# Patient Record
Sex: Female | Born: 1960 | Race: Black or African American | Hispanic: No | Marital: Single | Smoking: Never smoker
Health system: Southern US, Community
[De-identification: ages and names within clinical notes are randomized; demographics above are authoritative.]

## PROBLEM LIST (undated history)

## (undated) DIAGNOSIS — T7840XA Allergy, unspecified, initial encounter: Secondary | ICD-10-CM

## (undated) DIAGNOSIS — R7303 Prediabetes: Secondary | ICD-10-CM

## (undated) DIAGNOSIS — D649 Anemia, unspecified: Secondary | ICD-10-CM

## (undated) DIAGNOSIS — Z803 Family history of malignant neoplasm of breast: Secondary | ICD-10-CM

## (undated) DIAGNOSIS — N189 Chronic kidney disease, unspecified: Secondary | ICD-10-CM

## (undated) DIAGNOSIS — N3281 Overactive bladder: Secondary | ICD-10-CM

## (undated) DIAGNOSIS — K219 Gastro-esophageal reflux disease without esophagitis: Secondary | ICD-10-CM

## (undated) DIAGNOSIS — R011 Cardiac murmur, unspecified: Secondary | ICD-10-CM

## (undated) DIAGNOSIS — E875 Hyperkalemia: Secondary | ICD-10-CM

## (undated) DIAGNOSIS — Z800 Family history of malignant neoplasm of digestive organs: Secondary | ICD-10-CM

## (undated) DIAGNOSIS — Z801 Family history of malignant neoplasm of trachea, bronchus and lung: Secondary | ICD-10-CM

## (undated) HISTORY — DX: Anemia, unspecified: D64.9

## (undated) HISTORY — PX: BREAST SURGERY: SHX581

## (undated) HISTORY — DX: Overactive bladder: N32.81

## (undated) HISTORY — DX: Family history of malignant neoplasm of digestive organs: Z80.0

## (undated) HISTORY — DX: Allergy, unspecified, initial encounter: T78.40XA

## (undated) HISTORY — DX: Family history of malignant neoplasm of trachea, bronchus and lung: Z80.1

## (undated) HISTORY — PX: LAPAROTOMY: SHX154

## (undated) HISTORY — DX: Family history of malignant neoplasm of breast: Z80.3

## (undated) HISTORY — PX: BREAST EXCISIONAL BIOPSY: SUR124

---

## 2006-01-16 ENCOUNTER — Ambulatory Visit: Payer: Self-pay | Admitting: Obstetrics and Gynecology

## 2006-01-16 IMAGING — MG UNKNOWN MG STUDY
1 series · 4 of 4 positions shown · non-contrast
Comparison: none

REASON FOR EXAM: screening mammo
COMMENTS:

PROCEDURE:     MAM - MAM DGTL SCREENING MAMMO W/CAD  - [DATE]  [DATE]
RESULT:     Routine images were obtained and compared to prior study of
[DATE].  The breasts are dense and nodular. No dominant mass is seen. No
suspicious microcalcifications or areas of architectural distortion.

[Series 37: R CC · right · 4 of 4 slices shown]
[im 1/4]
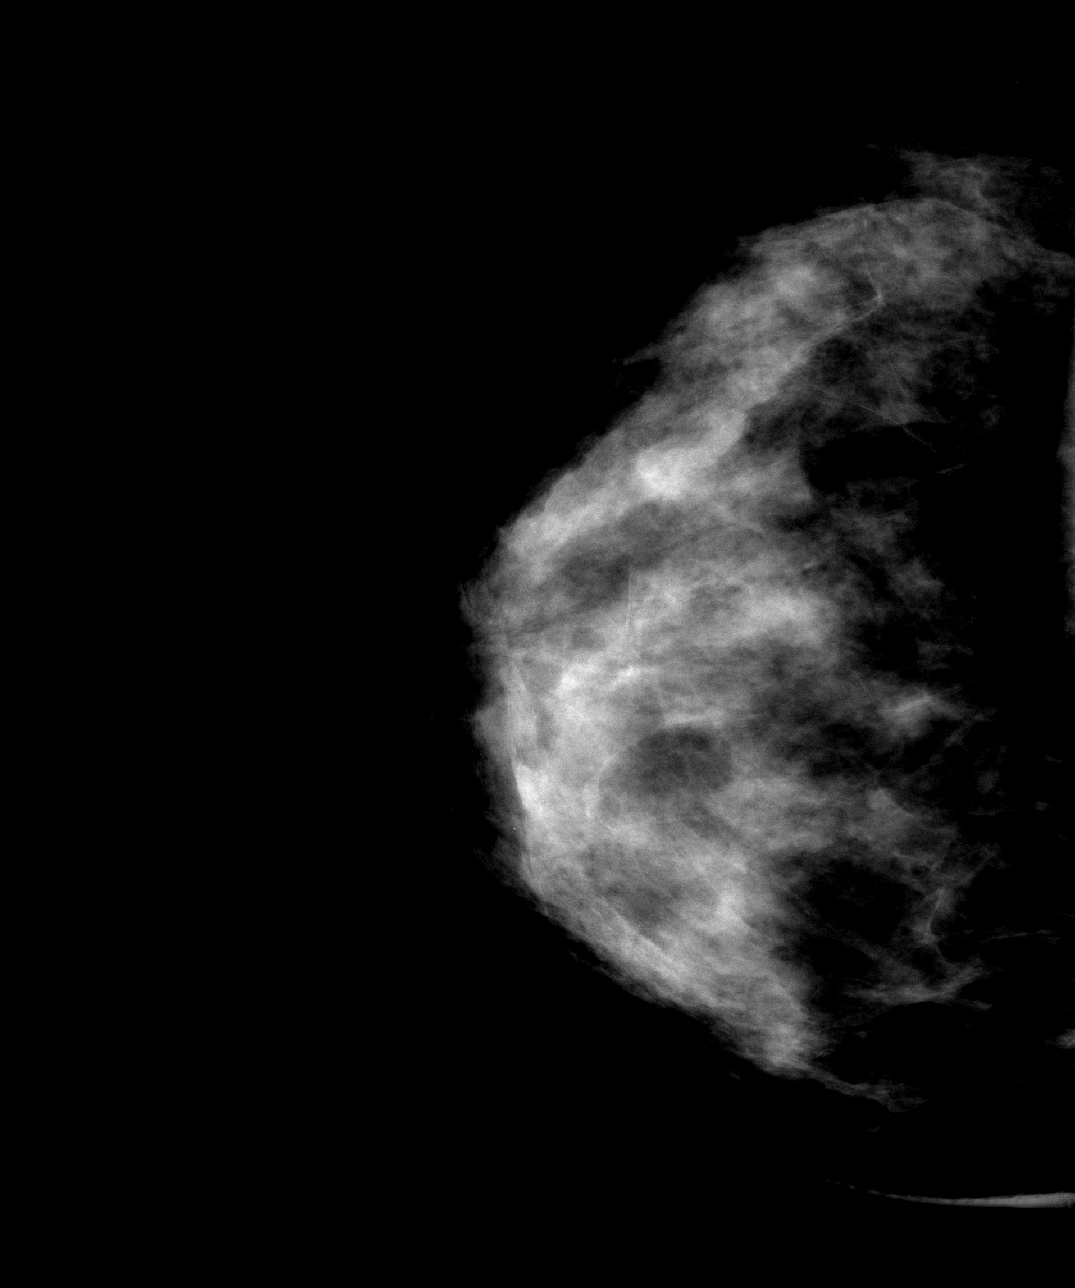
[im 2/4]
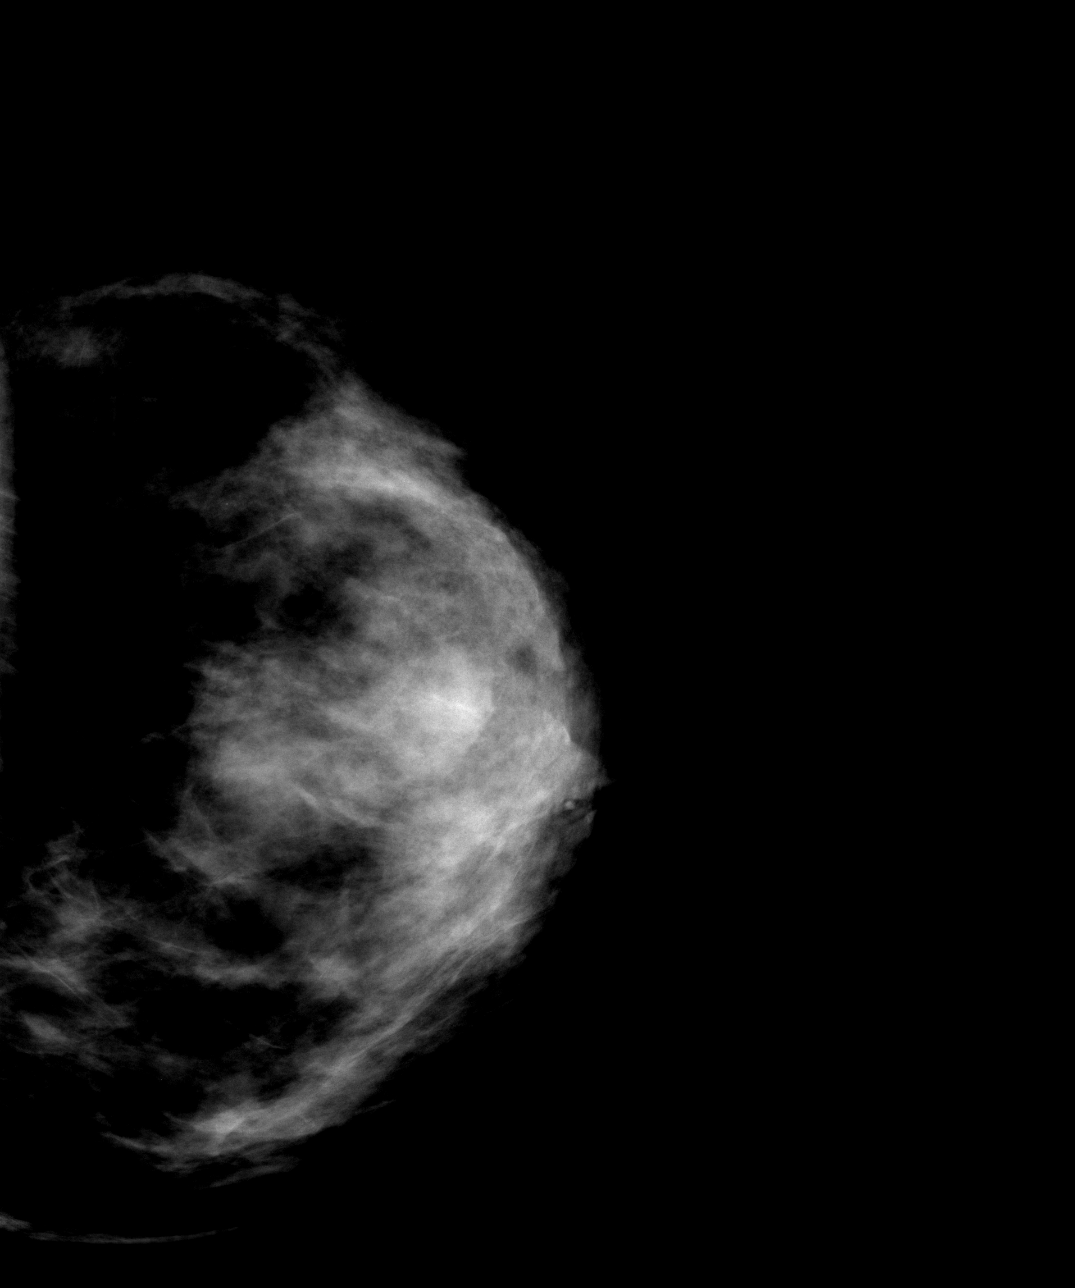
[im 3/4]
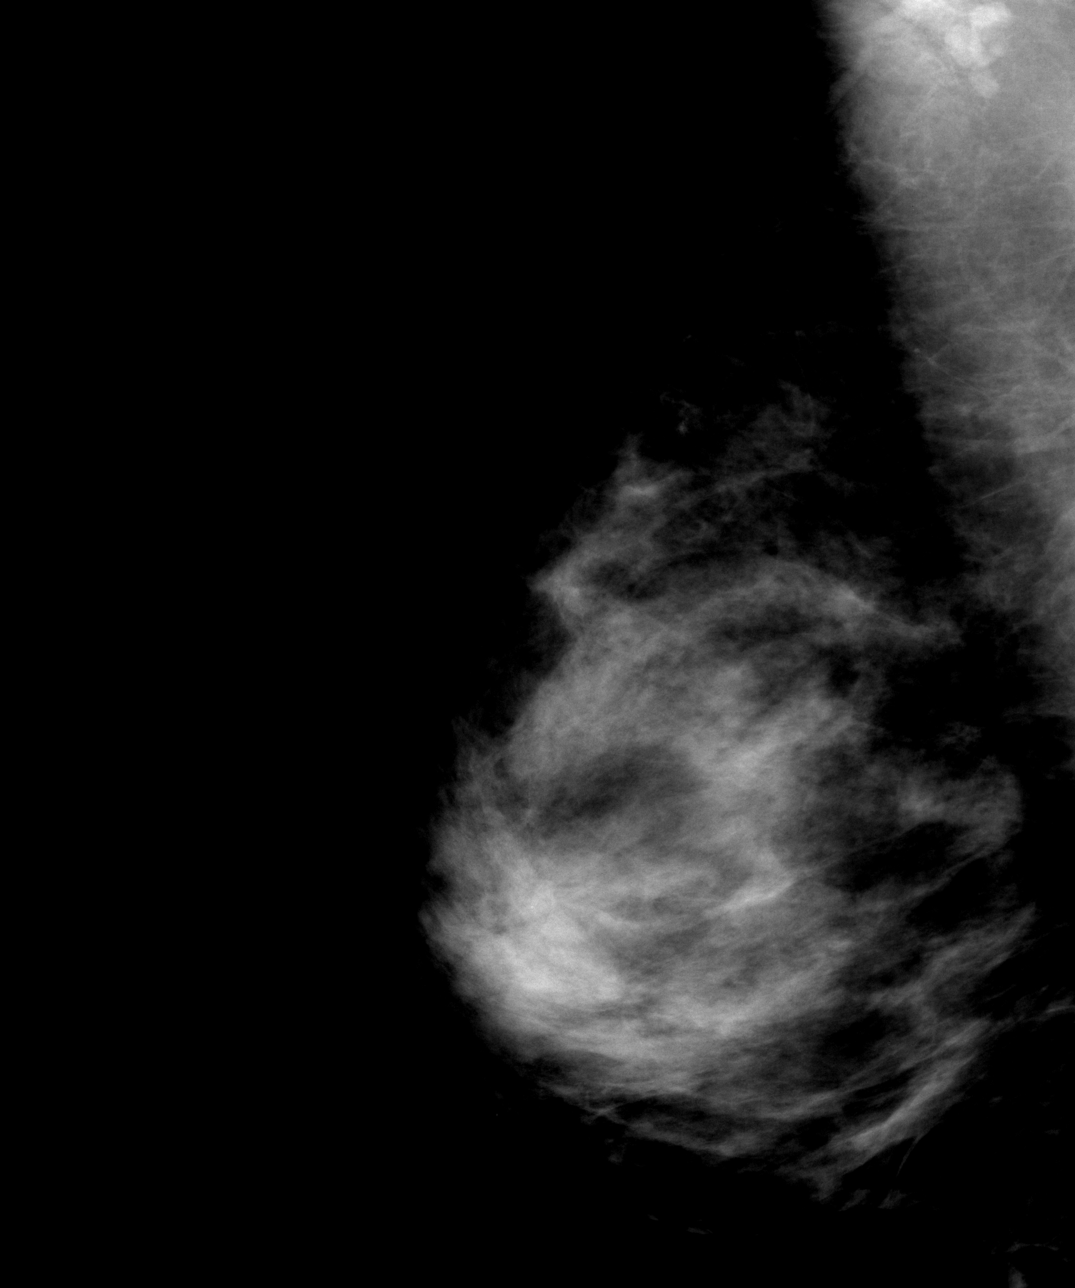
[im 4/4]
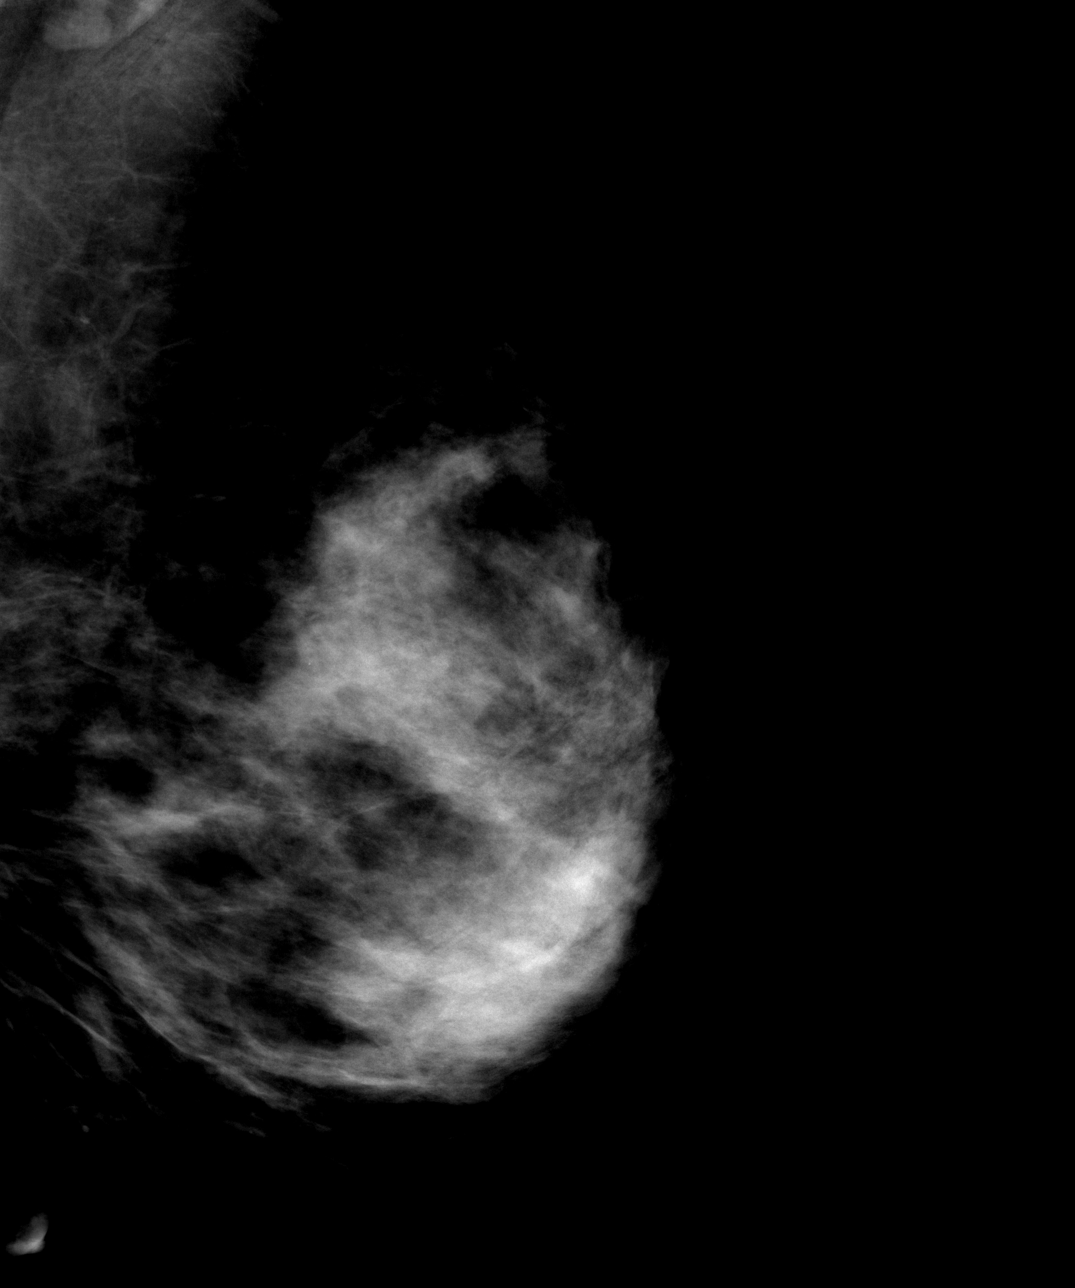

[4 of 4 positions shown; findings below may reference images not displayed]

IMPRESSION: 1)Dense, nodular breasts. Continued annual follow-up would be recommended.

BI-RADS: Category 2-Benign Finding

A NEGATIVE MAMMOGRAM REPORT DOES NOT PRECLUDE BIOPSY OR OTHER EVALUATION OF
A CLINICALLY PALPABLE OR OTHERWISE SUSPICIOUS MASS OR LESION.  BREAST CANCER
MAY NOT BE DETECTED BY MAMMOGRAPHY IN UP TO 10% OF CASES.

## 2007-01-17 ENCOUNTER — Ambulatory Visit: Payer: Self-pay | Admitting: Obstetrics and Gynecology

## 2007-01-17 IMAGING — MG UNKNOWN MG STUDY
1 series · 4 of 4 positions shown · non-contrast
Comparison: none

REASON FOR EXAM: screening mammo
COMMENTS:

PROCEDURE:     MAM - MAM DGTL SCREENING MAMMO W/CAD  - [DATE]  [DATE]
RESULT:     Comparison is made to the prior study dated [DATE] and [DATE].
The breasts demonstrate a heterogeneous fibronodular parenchymal pattern.
There does not appear to be radiographic evidence to suggest malignancy.

[R CC · right · 4 of 4 slices shown]
[im 1/4]
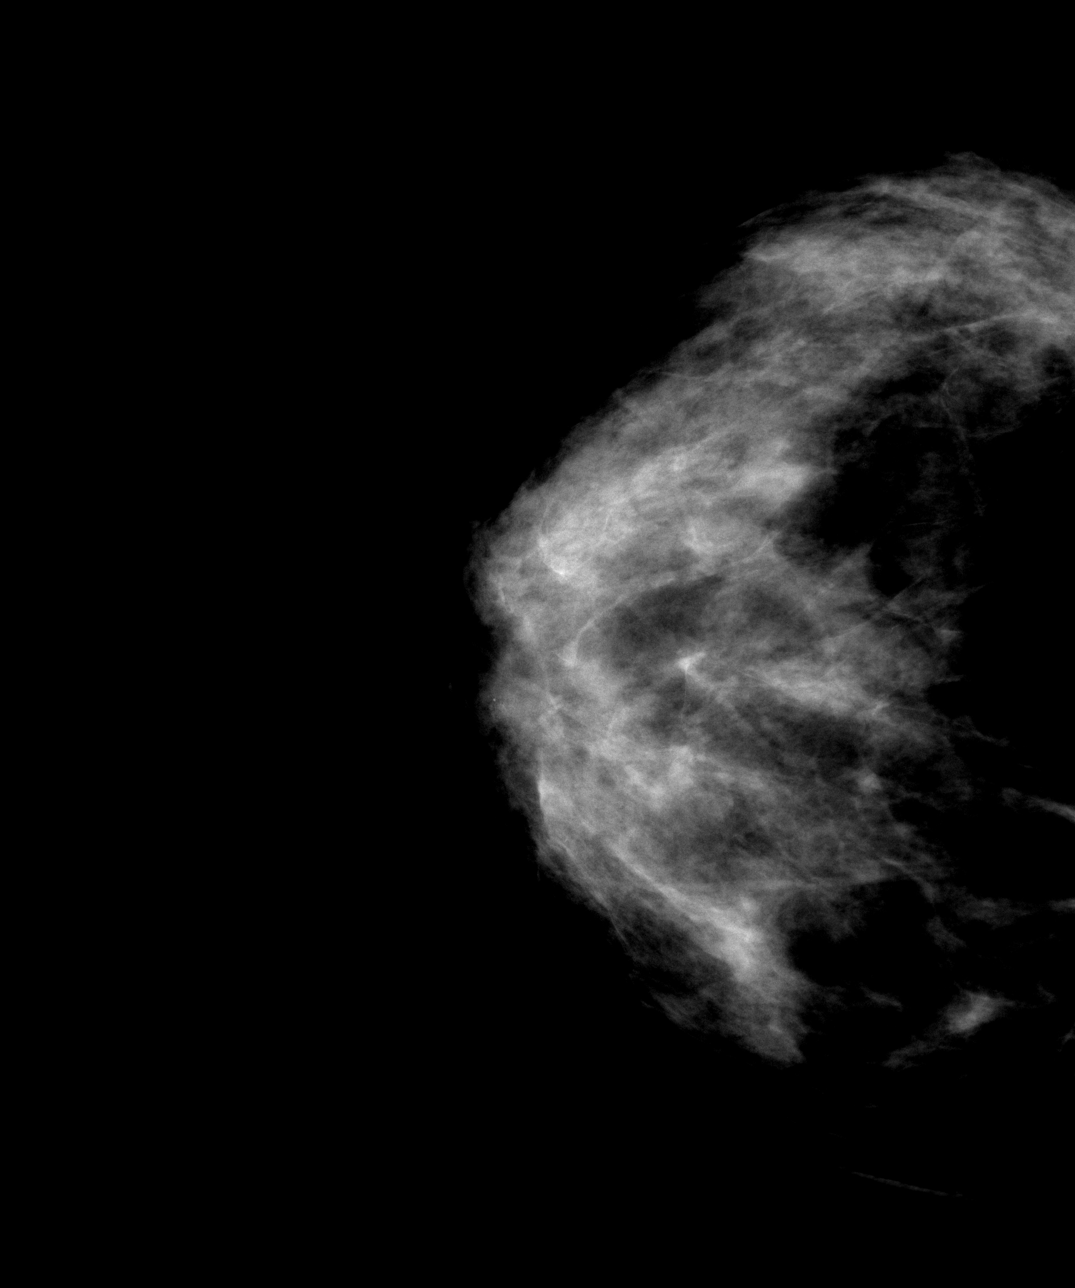
[im 2/4]
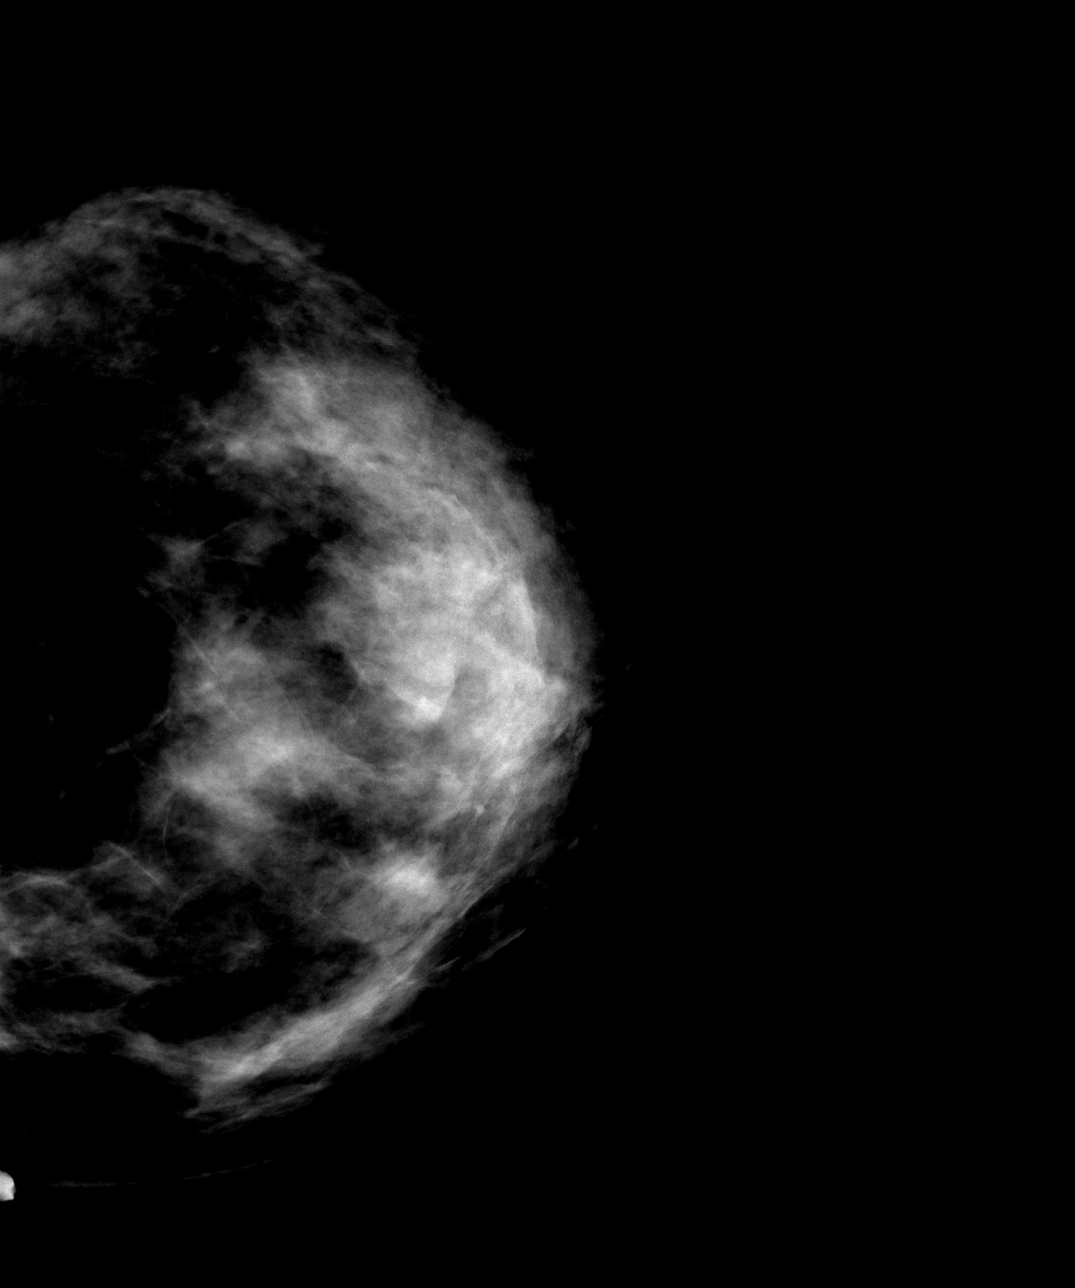
[im 3/4]
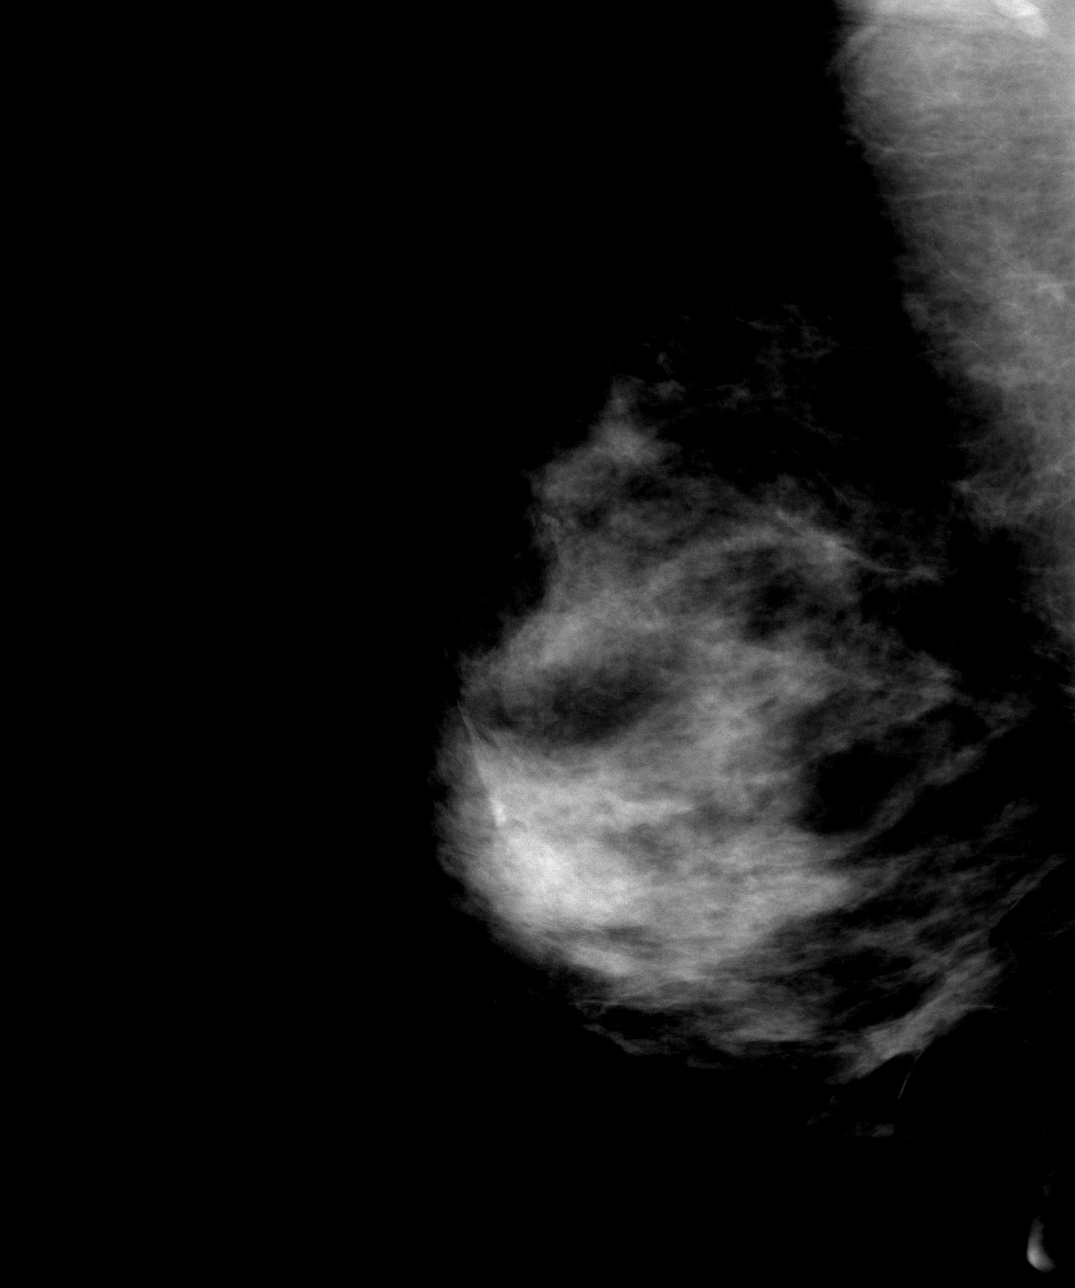
[im 4/4]
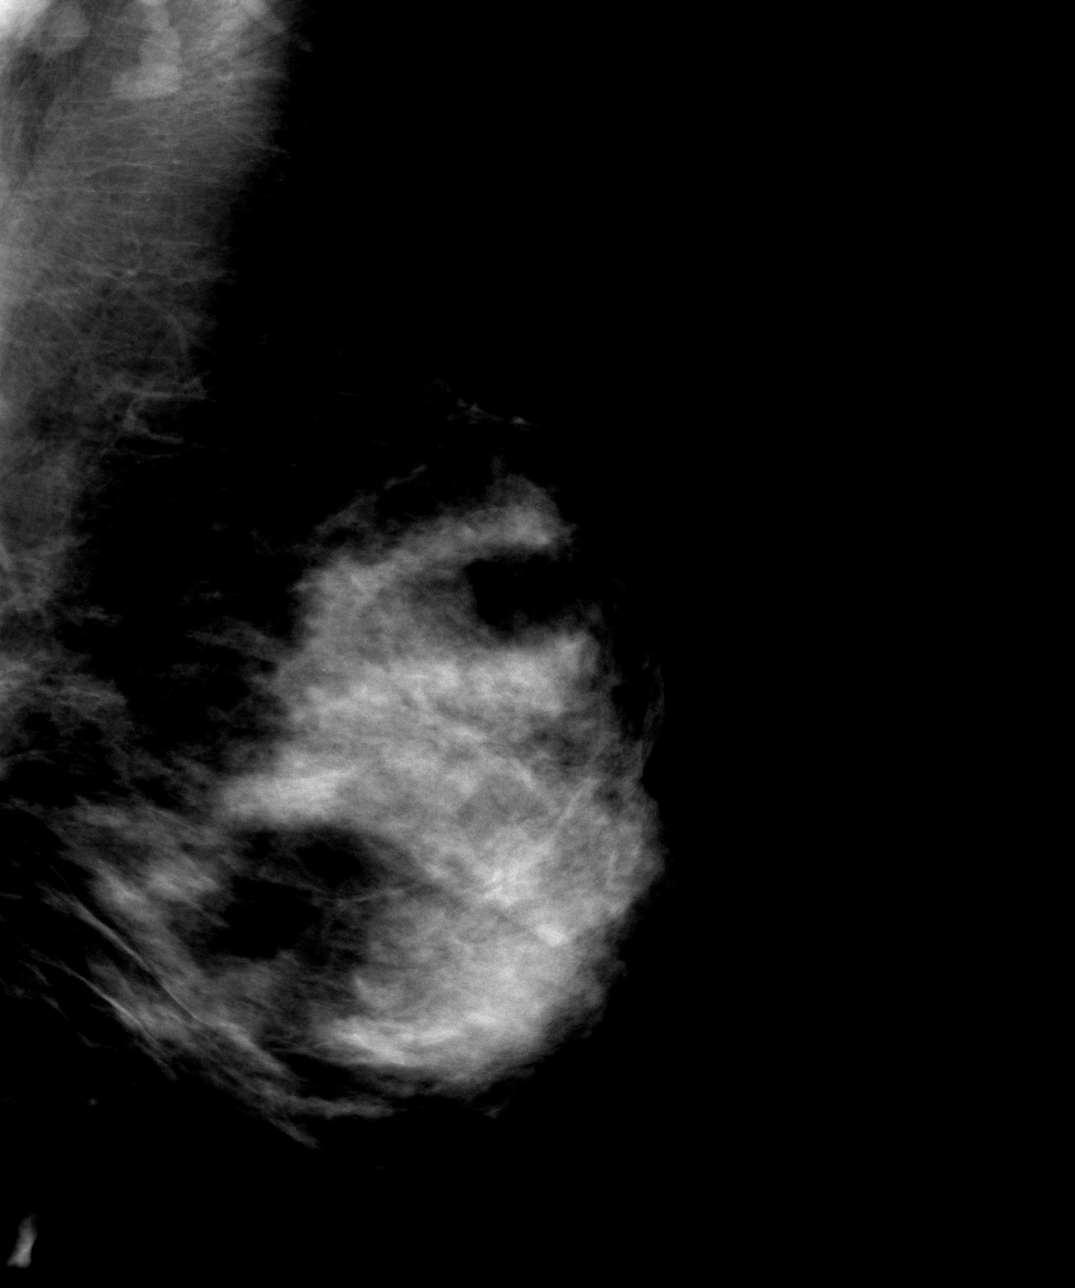

[4 of 4 positions shown; findings below may reference images not displayed]

IMPRESSION: Stable findings.

Corresponds to BI-RADS 2 - Benign Findings.

A NEGATIVE MAMMOGRAM REPORT DOES NOT PRECLUDE BIOPSY OR OTHER EVALUATION OF
A CLINICALLY PALPABLE OR OTHERWISE SUSPICIOUS MASS OR LESION.  BREAST CANCER
MAY NOT BE DETECTED BY MAMMOGRAPHY IN UP TO 10% OF CASES.

## 2011-11-13 ENCOUNTER — Ambulatory Visit (INDEPENDENT_AMBULATORY_CARE_PROVIDER_SITE_OTHER): Payer: BC Managed Care – PPO | Admitting: Internal Medicine

## 2011-11-13 ENCOUNTER — Encounter: Payer: Self-pay | Admitting: Internal Medicine

## 2011-11-13 ENCOUNTER — Other Ambulatory Visit: Payer: Self-pay | Admitting: *Deleted

## 2011-11-13 DIAGNOSIS — R319 Hematuria, unspecified: Secondary | ICD-10-CM | POA: Insufficient documentation

## 2011-11-13 DIAGNOSIS — Z0000 Encounter for general adult medical examination without abnormal findings: Secondary | ICD-10-CM

## 2011-11-13 DIAGNOSIS — K921 Melena: Secondary | ICD-10-CM

## 2011-11-13 LAB — COMPREHENSIVE METABOLIC PANEL
ALT: 25 U/L (ref 0–35)
Albumin: 3.9 g/dL (ref 3.5–5.2)
CO2: 28 mEq/L (ref 19–32)
Calcium: 9.4 mg/dL (ref 8.4–10.5)
Chloride: 104 mEq/L (ref 96–112)
GFR: 69.03 mL/min (ref >60.00)
Glucose, Bld: 83 mg/dL (ref 70–99)
Sodium: 141 mEq/L (ref 135–145)
Total Protein: 8.3 g/dL (ref 6.0–8.3)

## 2011-11-13 LAB — CBC WITH DIFFERENTIAL/PLATELET
Basophils Absolute: 0 10*3/uL (ref 0.0–0.1)
Eosinophils Relative: 0.6 % (ref 0.0–5.0)
Hemoglobin: 10.8 g/dL — ABNORMAL LOW (ref 12.0–15.0)
Lymphocytes Relative: 31.5 % (ref 12.0–46.0)
Monocytes Relative: 9.9 % (ref 3.0–12.0)
Platelets: 310 10*3/uL (ref 150.0–400.0)
RDW: 15.3 % — ABNORMAL HIGH (ref 11.5–14.6)
WBC: 9 10*3/uL (ref 4.5–10.5)

## 2011-11-13 LAB — LIPID PANEL
HDL: 57.6 mg/dL (ref >39.00)
Total CHOL/HDL Ratio: 3

## 2011-11-13 LAB — POCT URINALYSIS DIPSTICK
Nitrite, UA: NEGATIVE
Protein, UA: 30
Urobilinogen, UA: 0.2
pH, UA: 7

## 2011-11-13 MED ORDER — SULFAMETHOXAZOLE-TRIMETHOPRIM 800-160 MG PO TABS: 1.0000 | ORAL_TABLET | Freq: Two times a day (BID) | ORAL | Status: AC

## 2011-11-13 NOTE — Progress Notes (Signed)
Subjective:    Patient ID: Alejandra Hill, female    DOB: 1961/07/22, 50 y.o.   MRN: [id number]  HPI 50 year old female presents to establish care. Her primary concern today is a year-long history of intermittent episodes of bloody stool. She reports that she had been evaluated by her previous physician for this and was set up for colonoscopy but she canceled this appointment. She notes that she was diagnosed with iron deficiency and has been treated with oral iron supplementation. She reports that the episodes of bloody stool occur only on occasion and not with every bowel movement. This is described as blood in the toilet and not just drops of blood on the toilet paper. She denies any black or tarry stool. She denies any abdominal pain. She also reports some blood in her urine. She denies any dysuria. She denies any flank pain. She does have increased urinary frequency. She notes that she was treated in the past by her physician with the medication to help control urinary leakage. She denies any fever or chills.  Outpatient Encounter Prescriptions as of 11/13/2011  Medication Sig Dispense Refill  . Biotin 1000 MCG tablet Take 1,000 mcg by mouth daily.        . Calcium Carb-Cholecalciferol (RA CALCIUM 600/VITAMIN D-3 PO) Take by mouth daily.        . cholecalciferol (VITAMIN D) 400 UNITS TABS Take 400 Units by mouth daily.        . cyanocobalamin 100 MCG tablet Take 50 mcg by mouth daily.        . ferrous gluconate (FERGON) 240 (27 FE) MG tablet Take 240 mg by mouth 2 (two) times daily.        . vitamin E 400 UNIT capsule Take 400 Units by mouth daily.          Review of Systems  Constitutional: Negative for fever, chills, appetite change, fatigue and unexpected weight change.  HENT: Negative for ear pain, congestion, sore throat, trouble swallowing, neck pain, voice change and sinus pressure.   Eyes: Negative for visual disturbance.  Respiratory: Negative for cough, shortness of breath,  wheezing and stridor.   Cardiovascular: Negative for chest pain, palpitations and leg swelling.  Gastrointestinal: Positive for blood in stool. Negative for nausea, vomiting, abdominal pain, diarrhea, constipation, abdominal distention and anal bleeding.  Genitourinary: Positive for frequency and hematuria. Negative for dysuria, urgency, flank pain, vaginal bleeding, vaginal discharge and vaginal pain.  Musculoskeletal: Negative for myalgias, arthralgias and gait problem.  Skin: Negative for color change and rash.  Neurological: Negative for dizziness and headaches.  Hematological: Negative for adenopathy. Does not bruise/bleed easily.  Psychiatric/Behavioral: Negative for suicidal ideas, sleep disturbance and dysphoric mood. The patient is not nervous/anxious.    BP 128/78  Pulse 69  Temp(Src) 97.9 F (36.6 C) (Oral)  Resp 16  Ht 5\' 5"  (1.651 m)  Wt 177 lb 8 oz (80.513 kg)  BMI 29.54 kg/m2  SpO2 99%  LMP 10/14/2011     Objective:   Physical Exam  Constitutional: She is oriented to person, place, and time. She appears well-developed and well-nourished. No distress.  HENT:  Head: Normocephalic and atraumatic.  Right Ear: External ear normal.  Left Ear: External ear normal.  Nose: Nose normal.  Mouth/Throat: Oropharynx is clear and moist. No oropharyngeal exudate.  Eyes: Conjunctivae are normal. Pupils are equal, round, and reactive to light. Right eye exhibits no discharge. Left eye exhibits no discharge. No scleral icterus.  Neck: Normal range of motion.  Neck supple. No tracheal deviation present. No thyromegaly present.  Cardiovascular: Normal rate, regular rhythm, normal heart sounds and intact distal pulses.  Exam reveals no gallop and no friction rub.   No murmur heard. Pulmonary/Chest: Effort normal and breath sounds normal. No respiratory distress. She has no wheezes. She has no rales. She exhibits no tenderness.  Abdominal: Soft. Bowel sounds are normal. She exhibits no  distension and no mass. There is no tenderness. There is no rebound and no guarding.  Musculoskeletal: Normal range of motion. She exhibits no edema and no tenderness.  Lymphadenopathy:    She has no cervical adenopathy.  Neurological: She is alert and oriented to person, place, and time. No cranial nerve deficit. She exhibits normal muscle tone. Coordination normal.  Skin: Skin is warm and dry. No rash noted. She is not diaphoretic. No erythema. No pallor.  Psychiatric: She has a normal mood and affect. Her behavior is normal. Judgment and thought content normal.          Assessment & Plan:  1. Hematochezia - suspect internal hemorrhoids. Will set her up again for colonoscopy. Will review her previous evaluation by her former physician. Will check CBC with labs today. She will followup in 2 weeks.  2. Hematuria - Symptoms not consistent with infection. Will check urinalysis with labs today. If hematuria present will set up urology evaluation.  3. Health maintenance - Will get records on previous evaluation and management. Will send CBC, CMP, lipids with labs today.  Follow up 2 weeks.

## 2011-11-13 NOTE — Progress Notes (Signed)
Addended by: Ronette Deter A on: 11/13/2011 12:34 PM   Modules accepted: Level of Service

## 2011-11-13 NOTE — Progress Notes (Signed)
Addended by: Alois Cliche on: 11/13/2011 02:18 PM   Modules accepted: Orders

## 2011-11-14 ENCOUNTER — Telehealth: Payer: Self-pay | Admitting: *Deleted

## 2011-11-14 NOTE — Telephone Encounter (Signed)
Spoke w/GI office at [facility] and explained patient's situation; was able to place her on the waiting list and also get her a work-in appointment scheduled in three weeks for 12.12.12 at 10:30am [pt will have a wait]. Dr Gilford Rile informed. Patient informed.

## 2011-11-14 NOTE — Telephone Encounter (Signed)
LMOM to inform patient concerning lab results, expediting referral w/GI; informed that we will be in contact with her shortly about this appointment.

## 2011-11-14 NOTE — Telephone Encounter (Signed)
Message copied by Rockwell Germany on Tue Nov 14, 2011  8:24 AM ------      Message from: Ronette Deter A      Created: Mon Nov 13, 2011  7:10 PM       Labs show that pt is anemic with hemoglobin of 10. Likely secondary to iron deficiency from blood loss in stool.  Need to expedite her evaluation with GI for colonoscopy.            Larene Beach, Can you please make sure eval with GI is asap? Thanks (If cannot get within 2 weeks, let me know, I will call dr.)

## 2011-11-16 LAB — URINE CULTURE

## 2011-11-29 ENCOUNTER — Ambulatory Visit: Payer: BC Managed Care – PPO | Admitting: Internal Medicine

## 2011-12-27 ENCOUNTER — Ambulatory Visit: Payer: Self-pay | Admitting: Gastroenterology

## 2012-01-10 ENCOUNTER — Ambulatory Visit (INDEPENDENT_AMBULATORY_CARE_PROVIDER_SITE_OTHER): Payer: BC Managed Care – PPO | Admitting: Internal Medicine

## 2012-01-10 ENCOUNTER — Encounter: Payer: Self-pay | Admitting: Internal Medicine

## 2012-01-10 VITALS — BP 118/62 | HR 69 | Temp 97.9°F | Ht 64.0 in | Wt 182.0 lb

## 2012-01-10 DIAGNOSIS — D649 Anemia, unspecified: Secondary | ICD-10-CM

## 2012-01-10 LAB — CBC WITH DIFFERENTIAL/PLATELET
Basophils Relative: 0.5 % (ref 0.0–3.0)
Eosinophils Absolute: 0.1 10*3/uL (ref 0.0–0.7)
Eosinophils Relative: 1.7 % (ref 0.0–5.0)
Hemoglobin: 11.4 g/dL — ABNORMAL LOW (ref 12.0–15.0)
Lymphocytes Relative: 37.6 % (ref 12.0–46.0)
MCHC: 33.7 g/dL (ref 30.0–36.0)
MCV: 86.7 fl (ref 78.0–100.0)
Neutro Abs: 4 10*3/uL (ref 1.4–7.7)
RBC: 3.92 Mil/uL (ref 3.87–5.11)
WBC: 7.7 10*3/uL (ref 4.5–10.5)

## 2012-01-10 NOTE — Progress Notes (Signed)
Subjective:    Patient ID: Alejandra Hill, female    DOB: 12/15/1961, 51 y.o.   MRN: [id number]  HPI 51 year old female with a recent history of hematochezia and iron deficiency anemia presents for followup. At her last visit, she was referred to GI medicine and had colonoscopy. She reports that colonoscopy was normal. She has not had any further episodes of hematochezia. She notes that she has resumed her menstrual cycles in the cycles have been heavy. However, she is not interested in any intervention such as NovaSure to help lessen blood flow. She reports that otherwise she is feeling well. She has no other complaints today.  Outpatient Encounter Prescriptions as of 01/10/2012  Medication Sig Dispense Refill  . Biotin 1000 MCG tablet Take 1,000 mcg by mouth daily.        . Calcium Carb-Cholecalciferol (RA CALCIUM 600/VITAMIN D-3 PO) Take by mouth daily.        . cholecalciferol (VITAMIN D) 400 UNITS TABS Take 400 Units by mouth daily.        . cyanocobalamin 100 MCG tablet Take 50 mcg by mouth daily.        . ferrous gluconate (FERGON) 240 (27 FE) MG tablet Take 240 mg by mouth 2 (two) times daily.        . vitamin E 400 UNIT capsule Take 400 Units by mouth daily.          Review of Systems  Constitutional: Negative for fever, chills, appetite change, fatigue and unexpected weight change.  HENT: Negative for ear pain, congestion, sore throat, trouble swallowing, neck pain, voice change and sinus pressure.   Eyes: Negative for visual disturbance.  Respiratory: Negative for cough, shortness of breath, wheezing and stridor.   Cardiovascular: Negative for chest pain, palpitations and leg swelling.  Gastrointestinal: Negative for nausea, vomiting, abdominal pain, diarrhea, constipation, blood in stool, abdominal distention and anal bleeding.  Genitourinary: Negative for dysuria and flank pain.  Musculoskeletal: Negative for myalgias, arthralgias and gait problem.  Skin: Negative for color  change and rash.  Neurological: Negative for dizziness and headaches.  Hematological: Negative for adenopathy. Does not bruise/bleed easily.  Psychiatric/Behavioral: Negative for suicidal ideas, sleep disturbance and dysphoric mood. The patient is not nervous/anxious.    BP 118/62  Pulse 69  Temp(Src) 97.9 F (36.6 C) (Oral)  Ht 5\' 4"  (1.626 m)  Wt 182 lb (82.555 kg)  BMI 31.24 kg/m2  SpO2 99%     Objective:   Physical Exam  Constitutional: She is oriented to person, place, and time. She appears well-developed and well-nourished. No distress.  HENT:  Head: Normocephalic and atraumatic.  Right Ear: External ear normal.  Left Ear: External ear normal.  Nose: Nose normal.  Mouth/Throat: Oropharynx is clear and moist. No oropharyngeal exudate.  Eyes: Conjunctivae are normal. Pupils are equal, round, and reactive to light. Right eye exhibits no discharge. Left eye exhibits no discharge. No scleral icterus.  Neck: Normal range of motion. Neck supple. No tracheal deviation present. No thyromegaly present.  Cardiovascular: Normal rate, regular rhythm, normal heart sounds and intact distal pulses.  Exam reveals no gallop and no friction rub.   No murmur heard. Pulmonary/Chest: Effort normal and breath sounds normal. No respiratory distress. She has no wheezes. She has no rales. She exhibits no tenderness.  Musculoskeletal: Normal range of motion. She exhibits no edema and no tenderness.  Lymphadenopathy:    She has no cervical adenopathy.  Neurological: She is alert and oriented to person, place, and  time. No cranial nerve deficit. She exhibits normal muscle tone. Coordination normal.  Skin: Skin is warm and dry. No rash noted. She is not diaphoretic. No erythema. No pallor.  Psychiatric: She has a normal mood and affect. Her behavior is normal. Judgment and thought content normal.          Assessment & Plan:  1. Anemia - Likely secondary to iron deficiency. No further hematochezia.   Menstrual cycles have been heavy per pt, but she does not want to consider Novasure or other intervention to help control blood loss. Will recheck CBC today. Continue Ferrous Sulfate. Follow up 6 months and prn.

## 2012-05-09 ENCOUNTER — Encounter: Payer: Self-pay | Admitting: Internal Medicine

## 2012-05-09 ENCOUNTER — Ambulatory Visit (INDEPENDENT_AMBULATORY_CARE_PROVIDER_SITE_OTHER): Payer: BC Managed Care – PPO | Admitting: Internal Medicine

## 2012-05-09 ENCOUNTER — Encounter: Payer: BC Managed Care – PPO | Admitting: Internal Medicine

## 2012-05-09 ENCOUNTER — Other Ambulatory Visit (HOSPITAL_COMMUNITY)
Admission: RE | Admit: 2012-05-09 | Discharge: 2012-05-09 | Disposition: A | Discharge status: Home / Self Care | Payer: BC Managed Care – PPO | Source: Ambulatory Visit | Attending: Internal Medicine | Admitting: Internal Medicine

## 2012-05-09 VITALS — BP 105/65 | HR 74 | Temp 97.7°F | Resp 16 | Ht 64.0 in | Wt 177.2 lb

## 2012-05-09 DIAGNOSIS — Z1159 Encounter for screening for other viral diseases: Secondary | ICD-10-CM | POA: Insufficient documentation

## 2012-05-09 DIAGNOSIS — Z01419 Encounter for gynecological examination (general) (routine) without abnormal findings: Secondary | ICD-10-CM | POA: Insufficient documentation

## 2012-05-09 DIAGNOSIS — R232 Flushing: Secondary | ICD-10-CM | POA: Insufficient documentation

## 2012-05-09 DIAGNOSIS — Z0000 Encounter for general adult medical examination without abnormal findings: Secondary | ICD-10-CM

## 2012-05-09 DIAGNOSIS — N951 Menopausal and female climacteric states: Secondary | ICD-10-CM

## 2012-05-09 LAB — CBC WITH DIFFERENTIAL/PLATELET
Basophils Absolute: 0 10*3/uL (ref 0.0–0.1)
Basophils Relative: 0.5 % (ref 0.0–3.0)
Eosinophils Absolute: 0.1 10*3/uL (ref 0.0–0.7)
Hemoglobin: 10.3 g/dL — ABNORMAL LOW (ref 12.0–15.0)
Lymphocytes Relative: 24.2 % (ref 12.0–46.0)
Lymphs Abs: 2.1 10*3/uL (ref 0.7–4.0)
MCHC: 32.7 g/dL (ref 30.0–36.0)
MCV: 85.8 fl (ref 78.0–100.0)
Monocytes Absolute: 0.9 10*3/uL (ref 0.1–1.0)
Neutro Abs: 5.4 10*3/uL (ref 1.4–7.7)
RBC: 3.66 Mil/uL — ABNORMAL LOW (ref 3.87–5.11)
RDW: 14.5 % (ref 11.5–14.6)

## 2012-05-09 LAB — COMPREHENSIVE METABOLIC PANEL
ALT: 24 U/L (ref 0–35)
AST: 23 U/L (ref 0–37)
Alkaline Phosphatase: 100 U/L (ref 39–117)
BUN: 16 mg/dL (ref 6–23)
Calcium: 9.3 mg/dL (ref 8.4–10.5)
Chloride: 104 mEq/L (ref 96–112)
Creatinine, Ser: 1 mg/dL (ref 0.4–1.2)
Total Bilirubin: 0.3 mg/dL (ref 0.3–1.2)

## 2012-05-09 LAB — LIPID PANEL
Cholesterol: 156 mg/dL (ref 0–200)
HDL: 52.7 mg/dL (ref >39.00)
Triglycerides: 53 mg/dL (ref 0.0–149.0)
VLDL: 10.6 mg/dL (ref 0.0–40.0)

## 2012-05-09 LAB — POCT URINALYSIS DIPSTICK
Bilirubin, UA: NEGATIVE
Glucose, UA: NEGATIVE
Nitrite, UA: NEGATIVE

## 2012-05-09 LAB — TSH: TSH: 0.68 u[IU]/mL (ref 0.35–5.50)

## 2012-05-09 LAB — LUTEINIZING HORMONE: LH: 47.09 m[IU]/mL

## 2012-05-09 NOTE — Progress Notes (Signed)
Addended by: Alois Cliche on: 05/09/2012 03:15 PM   Modules accepted: Orders

## 2012-05-09 NOTE — Assessment & Plan Note (Signed)
General exam including pelvic exam and breast exam are normal today. Pap is pending. Will check lab work including CBC, CMP, lipids, TSH, LH, and [place]. Mammogram was ordered. Patient will followup in 6 months or sooner if needed.

## 2012-05-09 NOTE — Assessment & Plan Note (Signed)
Likely secondary to menopause. Will check LH and FSH with labs.

## 2012-05-09 NOTE — Progress Notes (Signed)
Subjective:    Patient ID: Alejandra Hill, female    DOB: [birth year], 50 y.o.   MRN: [id number]  HPI 51 year old female with history of iron deficiency anemia presents for annual exam. She reports that she is generally feeling well with the exception of occasional hot flashes. She notes these intermittently throughout the day and night. They're becoming more frequent. She has had some irregular menses and less frequent menses. She denies any other concerns today. She reports normal energy level. She reports normal appetite. She denies any changes in bowel habits. She reports full compliance with her iron supplements.  Outpatient Encounter Prescriptions as of 05/09/2012  Medication Sig Dispense Refill  . Biotin 1000 MCG tablet Take 1,000 mcg by mouth daily.        . Calcium Carb-Cholecalciferol (RA CALCIUM 600/VITAMIN D-3 PO) Take by mouth daily.        . cholecalciferol (VITAMIN D) 400 UNITS TABS Take 400 Units by mouth daily.        . cyanocobalamin 100 MCG tablet Take 50 mcg by mouth daily.        . ferrous gluconate (FERGON) 240 (27 FE) MG tablet Take 240 mg by mouth 2 (two) times daily.        . vitamin E 400 UNIT capsule Take 400 Units by mouth daily.          Review of Systems  Constitutional: Positive for diaphoresis. Negative for fever, chills, appetite change, fatigue and unexpected weight change.  HENT: Negative for ear pain, congestion, sore throat, trouble swallowing, neck pain, voice change and sinus pressure.   Eyes: Negative for visual disturbance.  Respiratory: Negative for cough, shortness of breath, wheezing and stridor.   Cardiovascular: Negative for chest pain, palpitations and leg swelling.  Gastrointestinal: Negative for nausea, vomiting, abdominal pain, diarrhea, constipation, blood in stool, abdominal distention and anal bleeding.  Genitourinary: Negative for dysuria and flank pain.  Musculoskeletal: Negative for myalgias, arthralgias and gait problem.  Skin: Negative  for color change and rash.  Neurological: Negative for dizziness and headaches.  Hematological: Negative for adenopathy. Does not bruise/bleed easily.  Psychiatric/Behavioral: Negative for suicidal ideas, sleep disturbance and dysphoric mood. The patient is not nervous/anxious.    BP 105/65  Pulse 74  Temp(Src) 97.7 F (36.5 C) (Oral)  Resp 16  Ht 5\' 4"  (1.626 m)  Wt 177 lb 4 oz (80.4 kg)  BMI 30.42 kg/m2  SpO2 99%  LMP 10/14/2011     Objective:   Physical Exam  Constitutional: She is oriented to person, place, and time. She appears well-developed and well-nourished. No distress.  HENT:  Head: Normocephalic and atraumatic.  Right Ear: External ear normal.  Left Ear: External ear normal.  Nose: Nose normal.  Mouth/Throat: Oropharynx is clear and moist. No oropharyngeal exudate.  Eyes: Conjunctivae are normal. Pupils are equal, round, and reactive to light. Right eye exhibits no discharge. Left eye exhibits no discharge. No scleral icterus.  Neck: Normal range of motion. Neck supple. No tracheal deviation present. No thyromegaly present.  Cardiovascular: Normal rate, regular rhythm, normal heart sounds and intact distal pulses.  Exam reveals no gallop and no friction rub.   No murmur heard. Pulmonary/Chest: Effort normal and breath sounds normal. No respiratory distress. She has no wheezes. She has no rales. She exhibits no tenderness.  Abdominal: Soft. Bowel sounds are normal. She exhibits no distension and no mass. There is no tenderness. There is no rebound and no guarding.  Genitourinary: Rectum normal, vagina normal  and uterus normal. No breast swelling, tenderness, discharge or bleeding. Pelvic exam was performed with patient supine. There is no rash, tenderness or lesion on the right labia. There is no rash, tenderness or lesion on the left labia. Uterus is not enlarged and not tender. Cervix exhibits no motion tenderness, no discharge and no friability. Right adnexum displays no  mass, no tenderness and no fullness. Left adnexum displays no mass, no tenderness and no fullness. No erythema or tenderness around the vagina. No vaginal discharge found.  Musculoskeletal: Normal range of motion. She exhibits no edema and no tenderness.  Lymphadenopathy:    She has no cervical adenopathy.  Neurological: She is alert and oriented to person, place, and time. No cranial nerve deficit. She exhibits normal muscle tone. Coordination normal.  Skin: Skin is warm and dry. No rash noted. She is not diaphoretic. No erythema. No pallor.  Psychiatric: She has a normal mood and affect. Her behavior is normal. Judgment and thought content normal.          Assessment & Plan:

## 2012-05-12 LAB — URINE CULTURE: Colony Count: >=100000

## 2012-05-13 ENCOUNTER — Other Ambulatory Visit: Payer: Self-pay | Admitting: *Deleted

## 2012-05-13 LAB — HM PAP SMEAR: HM Pap smear: NORMAL

## 2012-05-13 MED ORDER — SULFAMETHOXAZOLE-TRIMETHOPRIM 400-80 MG PO TABS: 1.0000 | ORAL_TABLET | Freq: Two times a day (BID) | ORAL | Status: AC

## 2012-08-20 ENCOUNTER — Telehealth: Payer: Self-pay | Admitting: Internal Medicine

## 2012-08-20 NOTE — Telephone Encounter (Signed)
Caller: [place]/Patient; Patient Name: Alejandra Hill, Alejandra Hill; PCP: Ronette Deter (Adults only); Best Callback Phone Number: [phone] Calling about having watery itchy eyes for past 5 years. She has tried Benedryl and Visine Allergy Eye Drops and not helping. L eye has whitish yellowish discharge onset 08/17/12 and eye feels gritty and itchy. Triage and Care advice per Eye Infection or Irritation Protocol and appnt advised within 4 hours. She is refusing appnt today and requested appnt on 08/21/12. Scheduled for 1130 -08/21/12 with Dr. Gilford Rile.

## 2012-08-21 ENCOUNTER — Ambulatory Visit: Payer: BC Managed Care – PPO | Admitting: Internal Medicine

## 2012-11-11 ENCOUNTER — Ambulatory Visit: Payer: BC Managed Care – PPO | Admitting: Internal Medicine

## 2012-11-13 ENCOUNTER — Encounter: Payer: Self-pay | Admitting: Internal Medicine

## 2012-11-13 ENCOUNTER — Ambulatory Visit (INDEPENDENT_AMBULATORY_CARE_PROVIDER_SITE_OTHER): Payer: BC Managed Care – PPO | Admitting: Internal Medicine

## 2012-11-13 VITALS — BP 112/82 | HR 66 | Temp 98.4°F | Ht 64.0 in | Wt 176.0 lb

## 2012-11-13 DIAGNOSIS — N390 Urinary tract infection, site not specified: Secondary | ICD-10-CM | POA: Insufficient documentation

## 2012-11-13 DIAGNOSIS — R7989 Other specified abnormal findings of blood chemistry: Secondary | ICD-10-CM

## 2012-11-13 DIAGNOSIS — N318 Other neuromuscular dysfunction of bladder: Secondary | ICD-10-CM

## 2012-11-13 DIAGNOSIS — N3281 Overactive bladder: Secondary | ICD-10-CM

## 2012-11-13 DIAGNOSIS — D649 Anemia, unspecified: Secondary | ICD-10-CM | POA: Insufficient documentation

## 2012-11-13 LAB — CBC WITH DIFFERENTIAL/PLATELET
Basophils Relative: 0.5 % (ref 0.0–3.0)
Eosinophils Absolute: 0.1 10*3/uL (ref 0.0–0.7)
Eosinophils Relative: 1.4 % (ref 0.0–5.0)
Hemoglobin: 9.9 g/dL — ABNORMAL LOW (ref 12.0–15.0)
Lymphocytes Relative: 29.2 % (ref 12.0–46.0)
MCHC: 31.3 g/dL (ref 30.0–36.0)
MCV: 84.6 fl (ref 78.0–100.0)
Neutro Abs: 5.5 10*3/uL (ref 1.4–7.7)
RBC: 3.74 Mil/uL — ABNORMAL LOW (ref 3.87–5.11)
WBC: 9 10*3/uL (ref 4.5–10.5)

## 2012-11-13 LAB — COMPREHENSIVE METABOLIC PANEL
AST: 38 U/L — ABNORMAL HIGH (ref 0–37)
Alkaline Phosphatase: 158 U/L — ABNORMAL HIGH (ref 39–117)
BUN: 11 mg/dL (ref 6–23)
Calcium: 9.4 mg/dL (ref 8.4–10.5)
Creatinine, Ser: 1.1 mg/dL (ref 0.4–1.2)
Glucose, Bld: 82 mg/dL (ref 70–99)

## 2012-11-13 LAB — POCT URINALYSIS DIPSTICK
Nitrite, UA: POSITIVE
Urobilinogen, UA: 0.2
pH, UA: 7

## 2012-11-13 MED ORDER — OXYBUTYNIN CHLORIDE ER 5 MG PO TB24: 5.0000 mg | ORAL_TABLET | Freq: Every day | ORAL | Status: DC

## 2012-11-13 MED ORDER — CIPROFLOXACIN HCL 500 MG PO TABS: 500.0000 mg | ORAL_TABLET | Freq: Two times a day (BID) | ORAL | Status: DC

## 2012-11-13 NOTE — Progress Notes (Signed)
Subjective:    Patient ID: Alejandra Hill, female    DOB: [birth year], 51 y.o.   MRN: [id number]  HPI 51 year old female with history of iron deficiency anemia presents for followup. She reports she is generally doing well. Her only concern today is several year history of frequent urination. She denies any fever, chills, flank pain, dysuria, hematuria. She has never tried medication for this. She has no known history of diabetes.   Outpatient Encounter Prescriptions as of 11/13/2012  Medication Sig Dispense Refill  . Calcium Carb-Cholecalciferol (RA CALCIUM 600/VITAMIN D-3 PO) Take by mouth daily.        . cholecalciferol (VITAMIN D) 400 UNITS TABS Take 400 Units by mouth daily.        . cyanocobalamin 100 MCG tablet Take 50 mcg by mouth daily.        . ferrous gluconate (FERGON) 240 (27 FE) MG tablet Take 240 mg by mouth 2 (two) times daily.        . vitamin E 400 UNIT capsule Take 400 Units by mouth daily.        . Biotin 1000 MCG tablet Take 1,000 mcg by mouth daily.        . ciprofloxacin (CIPRO) 500 MG tablet Take 1 tablet (500 mg total) by mouth 2 (two) times daily.  6 tablet  0  . oxybutynin (DITROPAN-XL) 5 MG 24 hr tablet Take 1 tablet (5 mg total) by mouth daily.  30 tablet  11   BP 112/82  Pulse 66  Temp 98.4 F (36.9 C) (Oral)  Ht 5\' 4"  (1.626 m)  Wt 176 lb (79.833 kg)  BMI 30.21 kg/m2  SpO2 99%  LMP 10/14/2011  Review of Systems  Constitutional: Negative for fever, chills, appetite change, fatigue and unexpected weight change.  HENT: Negative for ear pain, congestion, sore throat, trouble swallowing, neck pain, voice change and sinus pressure.   Eyes: Negative for visual disturbance.  Respiratory: Negative for cough, shortness of breath, wheezing and stridor.   Cardiovascular: Negative for chest pain, palpitations and leg swelling.  Gastrointestinal: Negative for nausea, vomiting, abdominal pain, diarrhea, constipation, blood in stool, abdominal distention and anal  bleeding.  Genitourinary: Positive for frequency. Negative for dysuria, urgency, hematuria, flank pain, decreased urine volume, vaginal bleeding, difficulty urinating, vaginal pain and pelvic pain.  Musculoskeletal: Negative for myalgias, arthralgias and gait problem.  Skin: Negative for color change and rash.  Neurological: Negative for dizziness and headaches.  Hematological: Negative for adenopathy. Does not bruise/bleed easily.  Psychiatric/Behavioral: Negative for suicidal ideas, sleep disturbance and dysphoric mood. The patient is not nervous/anxious.        Objective:   Physical Exam  Constitutional: She is oriented to person, place, and time. She appears well-developed and well-nourished. No distress.  HENT:  Head: Normocephalic and atraumatic.  Right Ear: External ear normal.  Left Ear: External ear normal.  Nose: Nose normal.  Mouth/Throat: Oropharynx is clear and moist. No oropharyngeal exudate.  Eyes: Conjunctivae normal are normal. Pupils are equal, round, and reactive to light. Right eye exhibits no discharge. Left eye exhibits no discharge. No scleral icterus.  Neck: Normal range of motion. Neck supple. No tracheal deviation present. No thyromegaly present.  Cardiovascular: Normal rate, regular rhythm, normal heart sounds and intact distal pulses.  Exam reveals no gallop and no friction rub.   No murmur heard. Pulmonary/Chest: Effort normal and breath sounds normal. No respiratory distress. She has no wheezes. She has no rales. She exhibits no tenderness.  Musculoskeletal:  Normal range of motion. She exhibits no edema and no tenderness.  Lymphadenopathy:    She has no cervical adenopathy.  Neurological: She is alert and oriented to person, place, and time. No cranial nerve deficit. She exhibits normal muscle tone. Coordination normal.  Skin: Skin is warm and dry. No rash noted. She is not diaphoretic. No erythema. No pallor.  Psychiatric: She has a normal mood and affect.  Her behavior is normal. Judgment and thought content normal.          Assessment & Plan:

## 2012-11-13 NOTE — Assessment & Plan Note (Signed)
Patient has urinary urgency but this is chronic for her. Urinalysis remarkable for blood and leukocytes. Will send urine for culture. Will treat empirically with Cipro. Followup in one month.

## 2012-11-13 NOTE — Assessment & Plan Note (Signed)
Patient with history of chronic iron deficiency anemia. Repeat CBC today shows slight decline in hemoglobin. Will confirm that patient is taking ferrous gluconate. Plan to repeat CBC in one month. If no improvement, will need referral to hematology for IV iron therapy.

## 2012-11-13 NOTE — Assessment & Plan Note (Signed)
Symptoms of overactive bladder. We discussed medications to help with symptoms such as Ditropan. Will start this today. On urinalysis, patient was noted to have signs of urinary tract infection. Will also treat infection with Cipro. Will send urine for culture. Followup one month.

## 2012-11-13 NOTE — Assessment & Plan Note (Signed)
Elevated liver function tests noted on labs today. Patient is asymptomatic. Will confirm the patient is not taking any new supplemental medications. Question of elevated liver function test may be related to viral infection. We'll plan to repeat liver function test in one month.

## 2012-11-14 ENCOUNTER — Other Ambulatory Visit: Payer: Self-pay | Admitting: *Deleted

## 2012-11-14 LAB — HIV ANTIBODY (ROUTINE TESTING W REFLEX): HIV: NONREACTIVE

## 2012-11-14 LAB — IBC PANEL: Iron: 38 ug/dL — ABNORMAL LOW (ref 42–145)

## 2012-11-14 NOTE — Addendum Note (Signed)
Addended by: Ronette Deter A on: 11/14/2012 11:45 AM   Modules accepted: Orders

## 2012-11-15 LAB — URINE CULTURE: Colony Count: >=100000

## 2012-12-03 ENCOUNTER — Ambulatory Visit: Payer: Self-pay | Admitting: Oncology

## 2012-12-03 LAB — CBC CANCER CENTER
Eosinophil #: 0.1 x10 3/mm (ref 0.0–0.7)
Eosinophil %: 1.8 %
HGB: 11.6 g/dL — ABNORMAL LOW (ref 12.0–16.0)
Lymphocyte %: 35.6 %
MCH: 28.1 pg (ref 26.0–34.0)
MCHC: 33.6 g/dL (ref 32.0–36.0)
Monocyte #: 0.6 x10 3/mm (ref 0.2–0.9)
Neutrophil %: 53.1 %
RBC: 4.13 10*6/uL (ref 3.80–5.20)

## 2012-12-03 LAB — IRON AND TIBC
Iron Bind.Cap.(Total): 356 ug/dL (ref 250–450)
Unbound Iron-Bind.Cap.: 300 ug/dL

## 2012-12-03 LAB — FOLATE: Folic Acid: 39.9 ng/mL (ref 3.1–100.0)

## 2012-12-03 LAB — RETICULOCYTES
Absolute Retic Count: 0.0617 10*6/uL (ref 0.023–0.096)
Reticulocyte: 1.49 % (ref 0.5–2.2)

## 2012-12-03 LAB — FERRITIN: Ferritin (ARMC): 97 ng/mL (ref 8–388)

## 2012-12-16 ENCOUNTER — Encounter: Payer: Self-pay | Admitting: Internal Medicine

## 2012-12-16 ENCOUNTER — Ambulatory Visit (INDEPENDENT_AMBULATORY_CARE_PROVIDER_SITE_OTHER): Payer: BC Managed Care – PPO | Admitting: Internal Medicine

## 2012-12-16 ENCOUNTER — Telehealth: Payer: Self-pay | Admitting: Internal Medicine

## 2012-12-16 VITALS — BP 118/68 | HR 72 | Temp 98.2°F | Resp 16 | Wt 181.5 lb

## 2012-12-16 DIAGNOSIS — D649 Anemia, unspecified: Secondary | ICD-10-CM

## 2012-12-16 DIAGNOSIS — Z1331 Encounter for screening for depression: Secondary | ICD-10-CM

## 2012-12-16 DIAGNOSIS — R7989 Other specified abnormal findings of blood chemistry: Secondary | ICD-10-CM

## 2012-12-16 DIAGNOSIS — N318 Other neuromuscular dysfunction of bladder: Secondary | ICD-10-CM

## 2012-12-16 DIAGNOSIS — N3281 Overactive bladder: Secondary | ICD-10-CM

## 2012-12-16 NOTE — Telephone Encounter (Signed)
Reviewed notes from [facility] hematology. Pt never had CMP repeated. This needs to be repeated in the next 1-2 weeks.

## 2012-12-16 NOTE — Assessment & Plan Note (Addendum)
Will request recent lab work obtained by her hematologist. If a CMP has not been repeated we'll plan to repeat our office.

## 2012-12-16 NOTE — Progress Notes (Signed)
Subjective:    Patient ID: Alejandra Hill, female    DOB: May 13, 1961, 51 y.o.   MRN: [id number]  HPI 51 year old female with history of overactive bladder and anemia presents for followup. She reports that her symptoms of overactive bladder have been well-controlled on Ditropan. She has noted some dry mouth but does not find this bothersome. She is now able to sleep through the night because she is not up to urinate. In regards to her anemia, she reports she was seen by hematologist and had lab work performed including blood counts which were improved compared to previous. She reports that they have decided not to proceed with iron infusion at this time. She denies any symptoms of fatigue, shortness of breath, chest pain. She does occasionally have bright red blood mixed in her stool and rectal pain associated with constipation. She did have a colonoscopy in January 2013 which she reports was normal.  Outpatient Encounter Prescriptions as of 12/16/2012  Medication Sig Dispense Refill  . Biotin 1000 MCG tablet Take 1,000 mcg by mouth daily.        . Calcium Carb-Cholecalciferol (RA CALCIUM 600/VITAMIN D-3 PO) Take by mouth daily.        . cholecalciferol (VITAMIN D) 400 UNITS TABS Take 400 Units by mouth daily.        . ciprofloxacin (CIPRO) 500 MG tablet Take 1 tablet (500 mg total) by mouth 2 (two) times daily.  6 tablet  0  . cyanocobalamin 100 MCG tablet Take 50 mcg by mouth daily.        . ferrous gluconate (FERGON) 240 (27 FE) MG tablet Take 240 mg by mouth 2 (two) times daily.        Marland Kitchen oxybutynin (DITROPAN-XL) 5 MG 24 hr tablet Take 1 tablet (5 mg total) by mouth daily.  30 tablet  11  . vitamin E 400 UNIT capsule Take 400 Units by mouth daily.         BP 118/68  Pulse 72  Temp 98.2 F (36.8 C) (Oral)  Resp 16  Wt 181 lb 8 oz (82.328 kg)  SpO2 97%  LMP 10/14/2011  Review of Systems  Constitutional: Negative for fever, chills, appetite change, fatigue and unexpected weight change.   HENT: Negative for ear pain, congestion, sore throat, trouble swallowing, neck pain, voice change and sinus pressure.   Eyes: Negative for visual disturbance.  Respiratory: Negative for cough, shortness of breath, wheezing and stridor.   Cardiovascular: Negative for chest pain, palpitations and leg swelling.  Gastrointestinal: Negative for nausea, vomiting, abdominal pain, diarrhea, constipation, blood in stool, abdominal distention and anal bleeding.  Genitourinary: Negative for dysuria and flank pain.  Musculoskeletal: Negative for myalgias, arthralgias and gait problem.  Skin: Negative for color change and rash.  Neurological: Negative for dizziness and headaches.  Hematological: Negative for adenopathy. Does not bruise/bleed easily.  Psychiatric/Behavioral: Negative for suicidal ideas, sleep disturbance and dysphoric mood. The patient is not nervous/anxious.        Objective:   Physical Exam  Constitutional: She is oriented to person, place, and time. She appears well-developed and well-nourished. No distress.  HENT:  Head: Normocephalic and atraumatic.  Right Ear: External ear normal.  Left Ear: External ear normal.  Nose: Nose normal.  Mouth/Throat: Oropharynx is clear and moist. No oropharyngeal exudate.  Eyes: Conjunctivae normal are normal. Pupils are equal, round, and reactive to light. Right eye exhibits no discharge. Left eye exhibits no discharge. No scleral icterus.  Neck: Normal range of  motion. Neck supple. No tracheal deviation present. No thyromegaly present.  Cardiovascular: Normal rate, regular rhythm, normal heart sounds and intact distal pulses.  Exam reveals no gallop and no friction rub.   No murmur heard. Pulmonary/Chest: Effort normal and breath sounds normal. No respiratory distress. She has no wheezes. She has no rales. She exhibits no tenderness.  Musculoskeletal: Normal range of motion. She exhibits no edema and no tenderness.  Lymphadenopathy:    She has  no cervical adenopathy.  Neurological: She is alert and oriented to person, place, and time. No cranial nerve deficit. She exhibits normal muscle tone. Coordination normal.  Skin: Skin is warm and dry. No rash noted. She is not diaphoretic. No erythema. No pallor.  Psychiatric: She has a normal mood and affect. Her behavior is normal. Judgment and thought content normal.          Assessment & Plan:

## 2012-12-16 NOTE — Assessment & Plan Note (Signed)
Symptoms of overactive bladder are much improved on Ditropan. Will continue.

## 2012-12-16 NOTE — Assessment & Plan Note (Addendum)
Patient reports that recent lab work including blood counts were improved. She was evaluated by hematology and he opted to hold off on iron infusion. Will request results on this. Will check stool Hemoccult. Followup in 3 months.

## 2012-12-17 NOTE — Telephone Encounter (Signed)
Left message asking patient to call back

## 2012-12-20 NOTE — Telephone Encounter (Signed)
Pt called and message could not be left due to full voicemail box.

## 2012-12-23 NOTE — Telephone Encounter (Signed)
Called pt could not leave message.

## 2012-12-25 ENCOUNTER — Ambulatory Visit: Payer: Self-pay | Admitting: Oncology

## 2013-01-23 NOTE — Telephone Encounter (Signed)
Called pt and advised that she needed repeat labs completed. Pt stated "Her doctor told her not to come back until her May appt and will not come in for labs until then".

## 2013-01-23 NOTE — Telephone Encounter (Signed)
Can you try to call this pt again?

## 2013-01-27 NOTE — Telephone Encounter (Signed)
Patient called to let us know that she is going to have labs done at [facility] 01/28/13.

## 2013-03-21 NOTE — Telephone Encounter (Signed)
I cannot find that this pt ever had CMP repeated to check LFTs. Can you ask her if this has been completed?

## 2013-03-24 NOTE — Telephone Encounter (Signed)
Patient mailbox is full, called left message on her sister's voicemail.

## 2013-03-26 ENCOUNTER — Encounter: Payer: Self-pay | Admitting: *Deleted

## 2013-03-26 NOTE — Telephone Encounter (Signed)
Mailed a letter to patient home address on file, we have a difficult time contacting her and if she could please contact the office to update her information.

## 2013-04-18 ENCOUNTER — Telehealth: Payer: Self-pay | Admitting: Internal Medicine

## 2013-04-18 NOTE — Telephone Encounter (Signed)
Received labs from 4/24 from [facility]. Labs show slightly low kidney function, elevated liver function tests, and anemia. Pt needs 37min follow up visit to review labs.

## 2013-04-21 ENCOUNTER — Encounter: Payer: Self-pay | Admitting: *Deleted

## 2013-04-21 NOTE — Telephone Encounter (Signed)
Called patient at the only number we have listed on her demographic information, very difficult to get in contact with this patient. However she does have an upcoming appointment in May, will mail a letter to patient home address on file.

## 2013-05-15 ENCOUNTER — Ambulatory Visit (INDEPENDENT_AMBULATORY_CARE_PROVIDER_SITE_OTHER): Payer: BC Managed Care – PPO | Admitting: Internal Medicine

## 2013-05-15 ENCOUNTER — Encounter: Payer: Self-pay | Admitting: Internal Medicine

## 2013-05-15 VITALS — BP 130/80 | HR 76 | Temp 98.4°F | Ht 64.75 in | Wt 177.0 lb

## 2013-05-15 DIAGNOSIS — K921 Melena: Secondary | ICD-10-CM

## 2013-05-15 DIAGNOSIS — D509 Iron deficiency anemia, unspecified: Secondary | ICD-10-CM

## 2013-05-15 DIAGNOSIS — Z0000 Encounter for general adult medical examination without abnormal findings: Secondary | ICD-10-CM

## 2013-05-15 DIAGNOSIS — R7989 Other specified abnormal findings of blood chemistry: Secondary | ICD-10-CM

## 2013-05-15 LAB — HEPATIC FUNCTION PANEL
Bilirubin, Direct: 0.1 mg/dL (ref 0.0–0.3)
Total Bilirubin: 0.4 mg/dL (ref 0.3–1.2)

## 2013-05-15 LAB — TSH: TSH: 1.01 u[IU]/mL (ref 0.35–5.50)

## 2013-05-15 NOTE — Assessment & Plan Note (Addendum)
Recent labs performed by patient's employer showed microcytic anemia with hemoglobin of 10. This is consistent with pt known h/o chronic iron deficiency anemia. She has been unable to tolerate oral iron. She also has signs of ongoing blood loss with GI bleeding. Will set up hematology evaluation. Suspect she would benefit from IV iron therapy.

## 2013-05-15 NOTE — Progress Notes (Signed)
Subjective:    Patient ID: Alejandra Hill, female    DOB: 03-15-1961, 52 y.o.   MRN: [id number]  HPI 52 year old female with history of chronic iron deficiency anemia, urinary incontinence presents for annual exam. After her last visit, she was noted to have iron deficiency anemia and was referred to hematology for evaluation. Their notes report that blood counts had improved and they opted not to treat with IV iron therapy. She has not taken oral iron supplementation. She has not followed up with them. She reports that in the interim since that visit, she has developed intermittent episodes of bright red blood in her stool. She occasionally has some constipation and straining but denies any rectal pain. The episodes of bloody stool are described as a large volume of blood. She denies abdominal pain, change in appetite, nausea, or vomiting. She reports that colonoscopy performed in 2013 was normal. recent hemoglobin was 10.  Aside from this, she reports she is feeling well. She recently had blood work performed by her employer which showed mild elevation of liver function tests. She denies any history of viral hepatitis. She denies excessive consumption of alcohol. She reports in the past she had excessive consumption of Tylenol for treatment of menstrual cramps, however this has stopped.  Outpatient Encounter Prescriptions as of 05/15/2013  Medication Sig Dispense Refill  . Biotin 1000 MCG tablet Take 1,000 mcg by mouth daily.        . Calcium Carb-Cholecalciferol (RA CALCIUM 600/VITAMIN D-3 PO) Take by mouth daily.        . ferrous gluconate (FERGON) 240 (27 FE) MG tablet Take 240 mg by mouth 2 (two) times daily.        . vitamin E 400 UNIT capsule Take 400 Units by mouth daily.        . cholecalciferol (VITAMIN D) 400 UNITS TABS Take 400 Units by mouth daily.        . cyanocobalamin 100 MCG tablet Take 50 mcg by mouth daily.        Marland Kitchen oxybutynin (DITROPAN-XL) 5 MG 24 hr tablet Take 1 tablet (5 mg  total) by mouth daily.  30 tablet  11  . [DISCONTINUED] ciprofloxacin (CIPRO) 500 MG tablet Take 1 tablet (500 mg total) by mouth 2 (two) times daily.  6 tablet  0   No facility-administered encounter medications on file as of 05/15/2013.   BP 130/80  Pulse 76  Temp(Src) 98.4 F (36.9 C) (Oral)  Ht 5' 4.75" (1.645 m)  Wt 177 lb (80.287 kg)  BMI 29.67 kg/m2  SpO2 98%  LMP 10/14/2011  Review of Systems  Constitutional: Negative for fever, chills, appetite change, fatigue and unexpected weight change.  HENT: Negative for ear pain, congestion, sore throat, trouble swallowing, neck pain, voice change and sinus pressure.   Eyes: Negative for visual disturbance.  Respiratory: Negative for cough, shortness of breath, wheezing and stridor.   Cardiovascular: Negative for chest pain, palpitations and leg swelling.  Gastrointestinal: Positive for blood in stool. Negative for nausea, vomiting, abdominal pain, diarrhea, constipation, abdominal distention, anal bleeding and rectal pain.  Genitourinary: Negative for dysuria and flank pain.  Musculoskeletal: Negative for myalgias, arthralgias and gait problem.  Skin: Negative for color change and rash.  Neurological: Negative for dizziness and headaches.  Hematological: Negative for adenopathy. Does not bruise/bleed easily.  Psychiatric/Behavioral: Negative for suicidal ideas, sleep disturbance and dysphoric mood. The patient is not nervous/anxious.        Objective:   Physical Exam  Constitutional: She is oriented to person, place, and time. She appears well-developed and well-nourished. No distress.  HENT:  Head: Normocephalic and atraumatic.  Right Ear: External ear normal.  Left Ear: External ear normal.  Nose: Nose normal.  Mouth/Throat: Oropharynx is clear and moist. No oropharyngeal exudate.  Eyes: Conjunctivae are normal. Pupils are equal, round, and reactive to light. Right eye exhibits no discharge. Left eye exhibits no discharge. No  scleral icterus.  Neck: Normal range of motion. Neck supple. No tracheal deviation present. No thyromegaly present.  Cardiovascular: Normal rate, regular rhythm, normal heart sounds and intact distal pulses.  Exam reveals no gallop and no friction rub.   No murmur heard. Pulmonary/Chest: Effort normal and breath sounds normal. No accessory muscle usage. Not tachypneic. No respiratory distress. She has no decreased breath sounds. She has no wheezes. She has no rhonchi. She has no rales. She exhibits no tenderness. Right breast exhibits no inverted nipple, no mass, no nipple discharge, no skin change and no tenderness. Left breast exhibits no inverted nipple, no mass, no nipple discharge, no skin change and no tenderness. Breasts are symmetrical.  Abdominal: Soft. Bowel sounds are normal. She exhibits no distension and no mass. There is no tenderness. There is no rebound and no guarding.  Musculoskeletal: Normal range of motion. She exhibits no edema and no tenderness.  Lymphadenopathy:    She has no cervical adenopathy.  Neurological: She is alert and oriented to person, place, and time. No cranial nerve deficit. She exhibits normal muscle tone. Coordination normal.  Skin: Skin is warm and dry. No rash noted. She is not diaphoretic. No erythema. No pallor.  Psychiatric: She has a normal mood and affect. Her behavior is normal. Judgment and thought content normal.          Assessment & Plan:

## 2013-05-15 NOTE — Assessment & Plan Note (Signed)
Recent lab work performed by [employer] employer showed elevated LFTs. Will check serologies for viral hepatitis.

## 2013-05-15 NOTE — Assessment & Plan Note (Signed)
General medical exam including breast exam normal today. Pelvic and Pap deferred as this was normal in 2013. Encourage continued efforts at healthy diet and regular physical activity. Immunizations are up-to-date. Mammogram ordered.

## 2013-05-15 NOTE — Assessment & Plan Note (Signed)
Patient reports intermittent episodes of bright red blood in her stool. This is described by the patient as a large volume of blood. She underwent colonoscopy in 2013 which was reportedly normal. However, symptoms have developed and worsened since that time. Will set up repeat GI evaluation. Question if flexible sigmoidoscopy might be helpful in identifying source of bleeding. Question she may have internal hemorrhoids.

## 2013-05-16 LAB — HEPATITIS B SURFACE ANTIGEN: Hepatitis B Surface Ag: NEGATIVE

## 2013-05-16 LAB — ANTI-NUCLEAR AB-TITER (ANA TITER): ANA Titer 1: NEGATIVE

## 2013-05-16 LAB — HEPATITIS B CORE ANTIBODY, TOTAL: Hep B Core Total Ab: NEGATIVE

## 2013-05-16 LAB — HEPATITIS B SURFACE ANTIBODY,QUALITATIVE: Hep B S Ab: REACTIVE — AB

## 2013-05-16 LAB — ANA: Anti Nuclear Antibody(ANA): POSITIVE — AB

## 2013-05-20 ENCOUNTER — Encounter: Payer: Self-pay | Admitting: *Deleted

## 2013-05-21 ENCOUNTER — Telehealth: Payer: Self-pay | Admitting: Hematology & Oncology

## 2013-05-21 NOTE — Telephone Encounter (Signed)
LVOM FOR PT TO RETURN CALL IN RE NP APPT.  °

## 2013-05-28 LAB — HM MAMMOGRAPHY: HM Mammogram: NEGATIVE

## 2013-05-30 ENCOUNTER — Telehealth: Payer: Self-pay | Admitting: Hematology & Oncology

## 2013-05-30 NOTE — Telephone Encounter (Signed)
LVOM FOR PT TO RETURN CALL IN RE NP APPT.  °

## 2013-06-06 ENCOUNTER — Encounter: Payer: Self-pay | Admitting: Internal Medicine

## 2013-06-11 ENCOUNTER — Encounter: Payer: Self-pay | Admitting: Emergency Medicine

## 2013-06-18 ENCOUNTER — Ambulatory Visit: Payer: BC Managed Care – PPO | Admitting: Internal Medicine

## 2013-06-30 ENCOUNTER — Encounter: Payer: Self-pay | Admitting: Emergency Medicine

## 2013-07-23 ENCOUNTER — Encounter: Payer: Self-pay | Admitting: Internal Medicine

## 2014-09-14 ENCOUNTER — Encounter: Payer: Self-pay | Admitting: *Deleted

## 2014-10-13 ENCOUNTER — Encounter: Payer: Self-pay | Admitting: Family Medicine

## 2014-10-13 ENCOUNTER — Ambulatory Visit (INDEPENDENT_AMBULATORY_CARE_PROVIDER_SITE_OTHER): Payer: BC Managed Care – PPO | Admitting: Family Medicine

## 2014-10-13 VITALS — BP 123/72 | HR 67 | Ht 64.0 in | Wt 174.0 lb

## 2014-10-13 DIAGNOSIS — Z01419 Encounter for gynecological examination (general) (routine) without abnormal findings: Secondary | ICD-10-CM

## 2014-10-13 DIAGNOSIS — R319 Hematuria, unspecified: Secondary | ICD-10-CM

## 2014-10-13 DIAGNOSIS — D259 Leiomyoma of uterus, unspecified: Secondary | ICD-10-CM

## 2014-10-13 LAB — POCT URINALYSIS DIPSTICK
BILIRUBIN UA: NEGATIVE
Glucose, UA: NEGATIVE
KETONES UA: NEGATIVE
Nitrite, UA: POSITIVE
PH UA: 6
PROTEIN UA: NEGATIVE
SPEC GRAV UA: 1.015
Urobilinogen, UA: NEGATIVE

## 2014-10-13 LAB — CBC
HCT: 35.5 % — ABNORMAL LOW (ref 36.0–46.0)
Hemoglobin: 11.5 g/dL — ABNORMAL LOW (ref 12.0–15.0)
MCH: 27.8 pg (ref 26.0–34.0)
MCHC: 32.4 g/dL (ref 30.0–36.0)
MCV: 86 fL (ref 78.0–100.0)
Platelets: 344 10*3/uL (ref 150–400)
RBC: 4.13 MIL/uL (ref 3.87–5.11)
RDW: 14.7 % (ref 11.5–15.5)
WBC: 7.4 10*3/uL (ref 4.0–10.5)

## 2014-10-13 LAB — COMPREHENSIVE METABOLIC PANEL
ALK PHOS: 122 U/L — AB (ref 39–117)
ALT: 26 U/L (ref 0–35)
AST: 18 U/L (ref 0–37)
Albumin: 4.1 g/dL (ref 3.5–5.2)
BUN: 31 mg/dL — ABNORMAL HIGH (ref 6–23)
CO2: 24 meq/L (ref 19–32)
CREATININE: 1.31 mg/dL — AB (ref 0.50–1.10)
Calcium: 9.7 mg/dL (ref 8.4–10.5)
Chloride: 104 mEq/L (ref 96–112)
Glucose, Bld: 81 mg/dL (ref 70–99)
Potassium: 3.9 mEq/L (ref 3.5–5.3)
Sodium: 138 mEq/L (ref 135–145)
TOTAL PROTEIN: 8 g/dL (ref 6.0–8.3)
Total Bilirubin: 0.5 mg/dL (ref 0.2–1.2)

## 2014-10-13 LAB — LIPID PANEL
CHOLESTEROL: 213 mg/dL — AB (ref 0–200)
HDL: 71 mg/dL (ref >39)
LDL CALC: 132 mg/dL — AB (ref 0–99)
Total CHOL/HDL Ratio: 3 Ratio
Triglycerides: 51 mg/dL (ref <150)
VLDL: 10 mg/dL (ref 0–40)

## 2014-10-13 LAB — TSH: TSH: 1.275 u[IU]/mL (ref 0.350–4.500)

## 2014-10-13 MED ORDER — SULFAMETHOXAZOLE-TMP DS 800-160 MG PO TABS: 1.0000 | ORAL_TABLET | Freq: Two times a day (BID) | ORAL | Status: DC

## 2014-10-13 NOTE — Progress Notes (Signed)
Subjective:     Alejandra Hill is a 53 y.o. female and is here for a comprehensive physical exam. The patient reports multiple problems.  Reports needing hysterectomy due to fibroids, although she has had no cycle x 3 years.  Denies pain.  She is concerned that the fibroids are putting blood into her urine. Has long h/o multiple SAB's.  History   Social History  . Marital Status: Single    Spouse Name: N/A    Number of Children: N/A  . Years of Education: N/A   Occupational History  . Not on file.   Social History Main Topics  . Smoking status: Never Smoker   . Smokeless tobacco: Not on file  . Alcohol Use: Yes  . Drug Use: No  . Sexual Activity: No   Other Topics Concern  . Not on file   Social History Narrative   Lives alone in [place]. Works as Sports coach.   Health Maintenance  Topic Date Due  . Influenza Vaccine  07/25/2014  . Pap Smear  05/16/2015  . Mammogram  05/29/2015  . Tetanus/tdap  05/12/2021  . Colonoscopy  12/27/2021    The following portions of the patient's history were reviewed and updated as appropriate: allergies, current medications, past family history, past medical history, past social history, past surgical history and problem list.  Review of Systems Otherwise negative  Constitutional: negative Respiratory: positive for dyspnea on exertion Cardiovascular: negative Gastrointestinal: positive for blood in her bowels, reports negative colonoscopy in 2013 at [facility] Genitourinary:positive for hematuria  No cycle x 3 years  Objective:    BP 123/72  Pulse 67  Ht 5\' 4"  (1.626 m)  Wt 174 lb (78.926 kg)  BMI 29.85 kg/m2  LMP 10/14/2011 General appearance: alert, cooperative and appears stated age Head: Normocephalic, without obvious abnormality, atraumatic Neck: no adenopathy, supple, symmetrical, trachea midline and thyroid not enlarged, symmetric, no tenderness/mass/nodules Lungs: clear to auscultation bilaterally Breasts:  normal appearance, no masses or tenderness Heart: regular rate and rhythm, S1, S2 normal, no murmur, click, rub or gallop Abdomen: soft, non-tender; bowel sounds normal; no masses,  no organomegaly Pelvic: cervix normal in appearance, external genitalia normal, no adnexal masses or tenderness, no cervical motion tenderness, uterus normal size, shape, and consistency and vagina normal without discharge Extremities: extremities normal, atraumatic, no cyanosis or edema Pulses: 2+ and symmetric Skin: Skin color, texture, turgor normal. No rashes or lesions Lymph nodes: Cervical, supraclavicular, and axillary nodes normal. Neurologic: Grossly normal   Color, UA  yellow   Clarity, UA  cloudy   Glucose, UA  negative   Bilirubin, UA  negative   Ketones, UA  negative   Spec Grav, UA  1.015   Blood, UA  3+   pH, UA  6.0   Protein, UA  negative   Urobilinogen, UA  negative   Nitrite, UA  positive   Leukocytes, UA  moderate (2+)      Assessment:    General female exam.      Plan:   Problem List Items Addressed This Visit     Unprioritized   Fibroid uterus    Other Visit Diagnoses   Encounter for routine gynecological examination    -  Primary    Relevant Orders       CBC       Comprehensive metabolic panel       Lipid panel       HIV antibody       TSH  RPR       GC/Chlamydia Probe Amp       MM DIGITAL SCREENING BILATERAL       POCT Urinalysis Dipstick (Completed)       Urine Culture    Hematuria        Relevant Medications       sulfamethoxazole-trimethoprim (BACTRIM DS) tablet 800-160 mg     If urinalysis, does not clear, would need urology referral.  Treat other issues as labs direct.--will likely need primary MD No pap necessary due to having one in 12/2013. Recent flu shot.    See After Visit Summary for Counseling Recommendations

## 2014-10-13 NOTE — Patient Instructions (Addendum)
Preventive Care for Adults A healthy lifestyle and preventive care can promote health and wellness. Preventive health guidelines for women include the following key practices.  A routine yearly physical is a good way to check with your health care provider about your health and preventive screening. It is a chance to share any concerns and updates on your health and to receive a thorough exam.  Visit your dentist for a routine exam and preventive care every 6 months. Brush your teeth twice a day and floss once a day. Good oral hygiene prevents tooth decay and gum disease.  The frequency of eye exams is based on your age, health, family medical history, use of contact lenses, and other factors. Follow your health care provider's recommendations for frequency of eye exams.  Eat a healthy diet. Foods like vegetables, fruits, whole grains, low-fat dairy products, and lean protein foods contain the nutrients you need without too many calories. Decrease your intake of foods high in solid fats, added sugars, and salt. Eat the right amount of calories for you.Get information about a proper diet from your health care provider, if necessary.  Regular physical exercise is one of the most important things you can do for your health. Most adults should get at least 150 minutes of moderate-intensity exercise (any activity that increases your heart rate and causes you to sweat) each week. In addition, most adults need muscle-strengthening exercises on 2 or more days a week.  Maintain a healthy weight. The body mass index (BMI) is a screening tool to identify possible weight problems. It provides an estimate of body fat based on height and weight. Your health care provider can find your BMI and can help you achieve or maintain a healthy weight.For adults 20 years and older:  A BMI below 18.5 is considered underweight.  A BMI of 18.5 to 24.9 is normal.  A BMI of 25 to 29.9 is considered overweight.  A BMI of  30 and above is considered obese.  Maintain normal blood lipids and cholesterol levels by exercising and minimizing your intake of saturated fat. Eat a balanced diet with plenty of fruit and vegetables. Blood tests for lipids and cholesterol should begin at age 76 and be repeated every 5 years. If your lipid or cholesterol levels are high, you are over 50, or you are at high risk for heart disease, you may need your cholesterol levels checked more frequently.Ongoing high lipid and cholesterol levels should be treated with medicines if diet and exercise are not working.  If you smoke, find out from your health care provider how to quit. If you do not use tobacco, do not start.  Lung cancer screening is recommended for adults aged 22-80 years who are at high risk for developing lung cancer because of a history of smoking. A yearly low-dose CT scan of the lungs is recommended for people who have at least a 30-pack-year history of smoking and are a current smoker or have quit within the past 15 years. A pack year of smoking is smoking an average of 1 pack of cigarettes a day for 1 year (for example: 1 pack a day for 30 years or 2 packs a day for 15 years). Yearly screening should continue until the smoker has stopped smoking for at least 15 years. Yearly screening should be stopped for people who develop a health problem that would prevent them from having lung cancer treatment.  If you are pregnant, do not drink alcohol. If you are breastfeeding,  be very cautious about drinking alcohol. If you are not pregnant and choose to drink alcohol, do not have more than 1 drink per day. One drink is considered to be 12 ounces (355 mL) of beer, 5 ounces (148 mL) of wine, or 1.5 ounces (44 mL) of liquor.  Avoid use of street drugs. Do not share needles with anyone. Ask for help if you need support or instructions about stopping the use of drugs.  High blood pressure causes heart disease and increases the risk of  stroke. Your blood pressure should be checked at least every 1 to 2 years. Ongoing high blood pressure should be treated with medicines if weight loss and exercise do not work.  If you are 3-86 years old, ask your health care provider if you should take aspirin to prevent strokes.  Diabetes screening involves taking a blood sample to check your fasting blood sugar level. This should be done once every 3 years, after age 67, if you are within normal weight and without risk factors for diabetes. Testing should be considered at a younger age or be carried out more frequently if you are overweight and have at least 1 risk factor for diabetes.  Breast cancer screening is essential preventive care for women. You should practice "breast self-awareness." This means understanding the normal appearance and feel of your breasts and may include breast self-examination. Any changes detected, no matter how small, should be reported to a health care provider. Women in their 8s and 30s should have a clinical breast exam (CBE) by a health care provider as part of a regular health exam every 1 to 3 years. After age 70, women should have a CBE every year. Starting at age 25, women should consider having a mammogram (breast X-ray test) every year. Women who have a family history of breast cancer should talk to their health care provider about genetic screening. Women at a high risk of breast cancer should talk to their health care providers about having an MRI and a mammogram every year.  Breast cancer gene (BRCA)-related cancer risk assessment is recommended for women who have family members with BRCA-related cancers. BRCA-related cancers include breast, ovarian, tubal, and peritoneal cancers. Having family members with these cancers may be associated with an increased risk for harmful changes (mutations) in the breast cancer genes BRCA1 and BRCA2. Results of the assessment will determine the need for genetic counseling and  BRCA1 and BRCA2 testing.  Routine pelvic exams to screen for cancer are no longer recommended for nonpregnant women who are considered low risk for cancer of the pelvic organs (ovaries, uterus, and vagina) and who do not have symptoms. Ask your health care provider if a screening pelvic exam is right for you.  If you have had past treatment for cervical cancer or a condition that could lead to cancer, you need Pap tests and screening for cancer for at least 20 years after your treatment. If Pap tests have been discontinued, your risk factors (such as having a new sexual partner) need to be reassessed to determine if screening should be resumed. Some women have medical problems that increase the chance of getting cervical cancer. In these cases, your health care provider may recommend more frequent screening and Pap tests.  The HPV test is an additional test that may be used for cervical cancer screening. The HPV test looks for the virus that can cause the cell changes on the cervix. The cells collected during the Pap test can be  tested for HPV. The HPV test could be used to screen women aged 30 years and older, and should be used in women of any age who have unclear Pap test results. After the age of 30, women should have HPV testing at the same frequency as a Pap test.  Colorectal cancer can be detected and often prevented. Most routine colorectal cancer screening begins at the age of 50 years and continues through age 75 years. However, your health care provider may recommend screening at an earlier age if you have risk factors for colon cancer. On a yearly basis, your health care provider may provide home test kits to check for hidden blood in the stool. Use of a small camera at the end of a tube, to directly examine the colon (sigmoidoscopy or colonoscopy), can detect the earliest forms of colorectal cancer. Talk to your health care provider about this at age 50, when routine screening begins. Direct  exam of the colon should be repeated every 5-10 years through age 75 years, unless early forms of pre-cancerous polyps or small growths are found.  People who are at an increased risk for hepatitis B should be screened for this virus. You are considered at high risk for hepatitis B if:  You were born in a country where hepatitis B occurs often. Talk with your health care provider about which countries are considered high risk.  Your parents were born in a high-risk country and you have not received a shot to protect against hepatitis B (hepatitis B vaccine).  You have HIV or AIDS.  You use needles to inject street drugs.  You live with, or have sex with, someone who has hepatitis B.  You get hemodialysis treatment.  You take certain medicines for conditions like cancer, organ transplantation, and autoimmune conditions.  Hepatitis C blood testing is recommended for all people born from 1945 through 1965 and any individual with known risks for hepatitis C.  Practice safe sex. Use condoms and avoid high-risk sexual practices to reduce the spread of sexually transmitted infections (STIs). STIs include gonorrhea, chlamydia, syphilis, trichomonas, herpes, HPV, and human immunodeficiency virus (HIV). Herpes, HIV, and HPV are viral illnesses that have no cure. They can result in disability, cancer, and death.  You should be screened for sexually transmitted illnesses (STIs) including gonorrhea and chlamydia if:  You are sexually active and are younger than 24 years.  You are older than 24 years and your health care provider tells you that you are at risk for this type of infection.  Your sexual activity has changed since you were last screened and you are at an increased risk for chlamydia or gonorrhea. Ask your health care provider if you are at risk.  If you are at risk of being infected with HIV, it is recommended that you take a prescription medicine daily to prevent HIV infection. This is  called preexposure prophylaxis (PrEP). You are considered at risk if:  You are a heterosexual woman, are sexually active, and are at increased risk for HIV infection.  You take drugs by injection.  You are sexually active with a partner who has HIV.  Talk with your health care provider about whether you are at high risk of being infected with HIV. If you choose to begin PrEP, you should first be tested for HIV. You should then be tested every 3 months for as long as you are taking PrEP.  Osteoporosis is a disease in which the bones lose minerals and strength   with aging. This can result in serious bone fractures or breaks. The risk of osteoporosis can be identified using a bone density scan. Women ages 65 years and over and women at risk for fractures or osteoporosis should discuss screening with their health care providers. Ask your health care provider whether you should take a calcium supplement or vitamin D to reduce the rate of osteoporosis.  Menopause can be associated with physical symptoms and risks. Hormone replacement therapy is available to decrease symptoms and risks. You should talk to your health care provider about whether hormone replacement therapy is right for you.  Use sunscreen. Apply sunscreen liberally and repeatedly throughout the day. You should seek shade when your shadow is shorter than you. Protect yourself by wearing long sleeves, pants, a wide-brimmed hat, and sunglasses year round, whenever you are outdoors.  Once a month, do a whole body skin exam, using a mirror to look at the skin on your back. Tell your health care provider of new moles, moles that have irregular borders, moles that are larger than a pencil eraser, or moles that have changed in shape or color.  Stay current with required vaccines (immunizations).  Influenza vaccine. All adults should be immunized every year.  Tetanus, diphtheria, and acellular pertussis (Td, Tdap) vaccine. Pregnant women should  receive 1 dose of Tdap vaccine during each pregnancy. The dose should be obtained regardless of the length of time since the last dose. Immunization is preferred during the 27th-36th week of gestation. An adult who has not previously received Tdap or who does not know her vaccine status should receive 1 dose of Tdap. This initial dose should be followed by tetanus and diphtheria toxoids (Td) booster doses every 10 years. Adults with an unknown or incomplete history of completing a 3-dose immunization series with Td-containing vaccines should begin or complete a primary immunization series including a Tdap dose. Adults should receive a Td booster every 10 years.  Varicella vaccine. An adult without evidence of immunity to varicella should receive 2 doses or a second dose if she has previously received 1 dose. Pregnant females who do not have evidence of immunity should receive the first dose after pregnancy. This first dose should be obtained before leaving the health care facility. The second dose should be obtained 4-8 weeks after the first dose.  Human papillomavirus (HPV) vaccine. Females aged 13-26 years who have not received the vaccine previously should obtain the 3-dose series. The vaccine is not recommended for use in pregnant females. However, pregnancy testing is not needed before receiving a dose. If a female is found to be pregnant after receiving a dose, no treatment is needed. In that case, the remaining doses should be delayed until after the pregnancy. Immunization is recommended for any person with an immunocompromised condition through the age of 26 years if she did not get any or all doses earlier. During the 3-dose series, the second dose should be obtained 4-8 weeks after the first dose. The third dose should be obtained 24 weeks after the first dose and 16 weeks after the second dose.  Zoster vaccine. One dose is recommended for adults aged 60 years or older unless certain conditions are  present.  Measles, mumps, and rubella (MMR) vaccine. Adults born before 1957 generally are considered immune to measles and mumps. Adults born in 1957 or later should have 1 or more doses of MMR vaccine unless there is a contraindication to the vaccine or there is laboratory evidence of immunity to   each of the three diseases. A routine second dose of MMR vaccine should be obtained at least 28 days after the first dose for students attending postsecondary schools, health care workers, or international travelers. People who received inactivated measles vaccine or an unknown type of measles vaccine during 1963-1967 should receive 2 doses of MMR vaccine. People who received inactivated mumps vaccine or an unknown type of mumps vaccine before 1979 and are at high risk for mumps infection should consider immunization with 2 doses of MMR vaccine. For females of childbearing age, rubella immunity should be determined. If there is no evidence of immunity, females who are not pregnant should be vaccinated. If there is no evidence of immunity, females who are pregnant should delay immunization until after pregnancy. Unvaccinated health care workers born before 1957 who lack laboratory evidence of measles, mumps, or rubella immunity or laboratory confirmation of disease should consider measles and mumps immunization with 2 doses of MMR vaccine or rubella immunization with 1 dose of MMR vaccine.  Pneumococcal 13-valent conjugate (PCV13) vaccine. When indicated, a person who is uncertain of her immunization history and has no record of immunization should receive the PCV13 vaccine. An adult aged 19 years or older who has certain medical conditions and has not been previously immunized should receive 1 dose of PCV13 vaccine. This PCV13 should be followed with a dose of pneumococcal polysaccharide (PPSV23) vaccine. The PPSV23 vaccine dose should be obtained at least 8 weeks after the dose of PCV13 vaccine. An adult aged 19  years or older who has certain medical conditions and previously received 1 or more doses of PPSV23 vaccine should receive 1 dose of PCV13. The PCV13 vaccine dose should be obtained 1 or more years after the last PPSV23 vaccine dose.  Pneumococcal polysaccharide (PPSV23) vaccine. When PCV13 is also indicated, PCV13 should be obtained first. All adults aged 65 years and older should be immunized. An adult younger than age 65 years who has certain medical conditions should be immunized. Any person who resides in a nursing home or long-term care facility should be immunized. An adult smoker should be immunized. People with an immunocompromised condition and certain other conditions should receive both PCV13 and PPSV23 vaccines. People with human immunodeficiency virus (HIV) infection should be immunized as soon as possible after diagnosis. Immunization during chemotherapy or radiation therapy should be avoided. Routine use of PPSV23 vaccine is not recommended for American Indians, [place] Natives, or people younger than 65 years unless there are medical conditions that require PPSV23 vaccine. When indicated, people who have unknown immunization and have no record of immunization should receive PPSV23 vaccine. One-time revaccination 5 years after the first dose of PPSV23 is recommended for people aged 19-64 years who have chronic kidney failure, nephrotic syndrome, asplenia, or immunocompromised conditions. People who received 1-2 doses of PPSV23 before age 65 years should receive another dose of PPSV23 vaccine at age 65 years or later if at least 5 years have passed since the previous dose. Doses of PPSV23 are not needed for people immunized with PPSV23 at or after age 65 years.  Meningococcal vaccine. Adults with asplenia or persistent complement component deficiencies should receive 2 doses of quadrivalent meningococcal conjugate (MenACWY-D) vaccine. The doses should be obtained at least 2 months apart.  Microbiologists working with certain meningococcal bacteria, military recruits, people at risk during an outbreak, and people who travel to or live in countries with a high rate of meningitis should be immunized. A first-year college student up through age   21 years who is living in a residence hall should receive a dose if she did not receive a dose on or after her 16th birthday. Adults who have certain high-risk conditions should receive one or more doses of vaccine.  Hepatitis A vaccine. Adults who wish to be protected from this disease, have certain high-risk conditions, work with hepatitis A-infected animals, work in hepatitis A research labs, or travel to or work in countries with a high rate of hepatitis A should be immunized. Adults who were previously unvaccinated and who anticipate close contact with an international adoptee during the first 60 days after arrival in the [place] from a country with a high rate of hepatitis A should be immunized.  Hepatitis B vaccine. Adults who wish to be protected from this disease, have certain high-risk conditions, may be exposed to blood or other infectious body fluids, are household contacts or sex partners of hepatitis B positive people, are clients or workers in certain care facilities, or travel to or work in countries with a high rate of hepatitis B should be immunized.  Haemophilus influenzae type b (Hib) vaccine. A previously unvaccinated person with asplenia or sickle cell disease or having a scheduled splenectomy should receive 1 dose of Hib vaccine. Regardless of previous immunization, a recipient of a hematopoietic stem cell transplant should receive a 3-dose series 6-12 months after her successful transplant. Hib vaccine is not recommended for adults with HIV infection. Preventive Services / Frequency Ages 64 to 68 years  Blood pressure check.** / Every 1 to 2 years.  Lipid and cholesterol check.** / Every 5 years beginning at age  22.  Clinical breast exam.** / Every 3 years for women in their 88s and 53s.  BRCA-related cancer risk assessment.** / For women who have family members with a BRCA-related cancer (breast, ovarian, tubal, or peritoneal cancers).  Pap test.** / Every 2 years from ages 90 through 51. Every 3 years starting at age 21 through age 56 or 3 with a history of 3 consecutive normal Pap tests.  HPV screening.** / Every 3 years from ages 24 through ages 1 to 46 with a history of 3 consecutive normal Pap tests.  Hepatitis C blood test.** / For any individual with known risks for hepatitis C.  Skin self-exam. / Monthly.  Influenza vaccine. / Every year.  Tetanus, diphtheria, and acellular pertussis (Tdap, Td) vaccine.** / Consult your health care provider. Pregnant women should receive 1 dose of Tdap vaccine during each pregnancy. 1 dose of Td every 10 years.  Varicella vaccine.** / Consult your health care provider. Pregnant females who do not have evidence of immunity should receive the first dose after pregnancy.  HPV vaccine. / 3 doses over 6 months, if 72 and younger. The vaccine is not recommended for use in pregnant females. However, pregnancy testing is not needed before receiving a dose.  Measles, mumps, rubella (MMR) vaccine.** / You need at least 1 dose of MMR if you were born in 1957 or later. You may also need a 2nd dose. For females of childbearing age, rubella immunity should be determined. If there is no evidence of immunity, females who are not pregnant should be vaccinated. If there is no evidence of immunity, females who are pregnant should delay immunization until after pregnancy.  Pneumococcal 13-valent conjugate (PCV13) vaccine.** / Consult your health care provider.  Pneumococcal polysaccharide (PPSV23) vaccine.** / 1 to 2 doses if you smoke cigarettes or if you have certain conditions.  Meningococcal vaccine.** /  1 dose if you are age 19 to 21 years and a first-year college  student living in a residence hall, or have one of several medical conditions, you need to get vaccinated against meningococcal disease. You may also need additional booster doses.  Hepatitis A vaccine.** / Consult your health care provider.  Hepatitis B vaccine.** / Consult your health care provider.  Haemophilus influenzae type b (Hib) vaccine.** / Consult your health care provider. Ages 40 to 64 years  Blood pressure check.** / Every 1 to 2 years.  Lipid and cholesterol check.** / Every 5 years beginning at age 20 years.  Lung cancer screening. / Every year if you are aged 55-80 years and have a 30-pack-year history of smoking and currently smoke or have quit within the past 15 years. Yearly screening is stopped once you have quit smoking for at least 15 years or develop a health problem that would prevent you from having lung cancer treatment.  Clinical breast exam.** / Every year after age 40 years.  BRCA-related cancer risk assessment.** / For women who have family members with a BRCA-related cancer (breast, ovarian, tubal, or peritoneal cancers).  Mammogram.** / Every year beginning at age 40 years and continuing for as long as you are in good health. Consult with your health care provider.  Pap test.** / Every 3 years starting at age 30 years through age 65 or 70 years with a history of 3 consecutive normal Pap tests.  HPV screening.** / Every 3 years from ages 30 years through ages 65 to 70 years with a history of 3 consecutive normal Pap tests.  Fecal occult blood test (FOBT) of stool. / Every year beginning at age 50 years and continuing until age 75 years. You may not need to do this test if you get a colonoscopy every 10 years.  Flexible sigmoidoscopy or colonoscopy.** / Every 5 years for a flexible sigmoidoscopy or every 10 years for a colonoscopy beginning at age 50 years and continuing until age 75 years.  Hepatitis C blood test.** / For all people born from 1945 through  1965 and any individual with known risks for hepatitis C.  Skin self-exam. / Monthly.  Influenza vaccine. / Every year.  Tetanus, diphtheria, and acellular pertussis (Tdap/Td) vaccine.** / Consult your health care provider. Pregnant women should receive 1 dose of Tdap vaccine during each pregnancy. 1 dose of Td every 10 years.  Varicella vaccine.** / Consult your health care provider. Pregnant females who do not have evidence of immunity should receive the first dose after pregnancy.  Zoster vaccine.** / 1 dose for adults aged 60 years or older.  Measles, mumps, rubella (MMR) vaccine.** / You need at least 1 dose of MMR if you were born in 1957 or later. You may also need a 2nd dose. For females of childbearing age, rubella immunity should be determined. If there is no evidence of immunity, females who are not pregnant should be vaccinated. If there is no evidence of immunity, females who are pregnant should delay immunization until after pregnancy.  Pneumococcal 13-valent conjugate (PCV13) vaccine.** / Consult your health care provider.  Pneumococcal polysaccharide (PPSV23) vaccine.** / 1 to 2 doses if you smoke cigarettes or if you have certain conditions.  Meningococcal vaccine.** / Consult your health care provider.  Hepatitis A vaccine.** / Consult your health care provider.  Hepatitis B vaccine.** / Consult your health care provider.  Haemophilus influenzae type b (Hib) vaccine.** / Consult your health care provider. Ages 65   years and over  Blood pressure check.** / Every 1 to 2 years.  Lipid and cholesterol check.** / Every 5 years beginning at age 88 years.  Lung cancer screening. / Every year if you are aged 35-80 years and have a 30-pack-year history of smoking and currently smoke or have quit within the past 15 years. Yearly screening is stopped once you have quit smoking for at least 15 years or develop a health problem that would prevent you from having lung cancer  treatment.  Clinical breast exam.** / Every year after age 9 years.  BRCA-related cancer risk assessment.** / For women who have family members with a BRCA-related cancer (breast, ovarian, tubal, or peritoneal cancers).  Mammogram.** / Every year beginning at age 14 years and continuing for as long as you are in good health. Consult with your health care provider.  Pap test.** / Every 3 years starting at age 81 years through age 43 or 56 years with 3 consecutive normal Pap tests. Testing can be stopped between 65 and 70 years with 3 consecutive normal Pap tests and no abnormal Pap or HPV tests in the past 10 years.  HPV screening.** / Every 3 years from ages 64 years through ages 49 or 97 years with a history of 3 consecutive normal Pap tests. Testing can be stopped between 65 and 70 years with 3 consecutive normal Pap tests and no abnormal Pap or HPV tests in the past 10 years.  Fecal occult blood test (FOBT) of stool. / Every year beginning at age 53 years and continuing until age 68 years. You may not need to do this test if you get a colonoscopy every 10 years.  Flexible sigmoidoscopy or colonoscopy.** / Every 5 years for a flexible sigmoidoscopy or every 10 years for a colonoscopy beginning at age 50 years and continuing until age 4 years.  Hepatitis C blood test.** / For all people born from [birth year] through 1967 and any individual with known risks for hepatitis C.  Osteoporosis screening.** / A one-time screening for women ages 37 years and over and women at risk for fractures or osteoporosis.  Skin self-exam. / Monthly.  Influenza vaccine. / Every year.  Tetanus, diphtheria, and acellular pertussis (Tdap/Td) vaccine.** / 1 dose of Td every 10 years.  Varicella vaccine.** / Consult your health care provider.  Zoster vaccine.** / 1 dose for adults aged 68 years or older.  Pneumococcal 13-valent conjugate (PCV13) vaccine.** / Consult your health care provider.  Pneumococcal  polysaccharide (PPSV23) vaccine.** / 1 dose for all adults aged 38 years and older.  Meningococcal vaccine.** / Consult your health care provider.  Hepatitis A vaccine.** / Consult your health care provider.  Hepatitis B vaccine.** / Consult your health care provider.  Haemophilus influenzae type b (Hib) vaccine.** / Consult your health care provider. ** Family history and personal history of risk and conditions may change your health care provider's recommendations. Document Released: 02/06/2002 Document Revised: 04/27/2014 Document Reviewed: 05/08/2011 [facility] Patient Information 2015 [place], [place]. This information is not intended to replace advice given to you by your health care provider. Make sure you discuss any questions you have with your health care provider. Hematuria Hematuria is blood in your urine. It can be caused by a bladder infection, kidney infection, prostate infection, kidney stone, or cancer of your urinary tract. Infections can usually be treated with medicine, and a kidney stone usually will pass through your urine. If neither of these is the cause of your hematuria,  further workup to find out the reason may be needed. It is very important that you tell your health care provider about any blood you see in your urine, even if the blood stops without treatment or happens without causing pain. Blood in your urine that happens and then stops and then happens again can be a symptom of a very serious condition. Also, pain is not a symptom in the initial stages of many urinary cancers. HOME CARE INSTRUCTIONS   Drink lots of fluid, 3-4 quarts a day. If you have been diagnosed with an infection, cranberry juice is especially recommended, in addition to large amounts of water.  Avoid caffeine, tea, and carbonated beverages because they tend to irritate the bladder.  Avoid alcohol because it may irritate the prostate.  Take all medicines as directed by your health care  provider.  If you were prescribed an antibiotic medicine, finish it all even if you start to feel better.  If you have been diagnosed with a kidney stone, follow your health care provider's instructions regarding straining your urine to catch the stone.  Empty your bladder often. Avoid holding urine for long periods of time.  After a bowel movement, women should cleanse front to back. Use each tissue only once.  Empty your bladder before and after sexual intercourse if you are a female. SEEK MEDICAL CARE IF:  You develop back pain.  You have a fever.  You have a feeling of sickness in your stomach (nausea) or vomiting.  Your symptoms are not better in 3 days. Return sooner if you are getting worse. SEEK IMMEDIATE MEDICAL CARE IF:   You develop severe vomiting and are unable to keep the medicine down.  You develop severe back or abdominal pain despite taking your medicines.  You begin passing a large amount of blood or clots in your urine.  You feel extremely weak or faint, or you pass out. MAKE SURE YOU:   Understand these instructions.  Will watch your condition.  Will get help right away if you are not doing well or get worse. Document Released: 12/11/2005 Document Revised: 04/27/2014 Document Reviewed: 08/11/2013 [facility] Patient Information 2015 [place], [place]. This information is not intended to replace advice given to you by your health care provider. Make sure you discuss any questions you have with your health care provider.

## 2014-10-14 LAB — HIV ANTIBODY (ROUTINE TESTING W REFLEX): HIV: NONREACTIVE

## 2014-10-14 LAB — GC/CHLAMYDIA PROBE AMP
CT Probe RNA: NEGATIVE
GC Probe RNA: NEGATIVE

## 2014-10-14 LAB — RPR

## 2014-10-16 ENCOUNTER — Telehealth: Payer: Self-pay | Admitting: *Deleted

## 2014-10-16 ENCOUNTER — Telehealth: Payer: Self-pay | Admitting: Internal Medicine

## 2014-10-16 DIAGNOSIS — E7800 Pure hypercholesterolemia, unspecified: Secondary | ICD-10-CM

## 2014-10-16 LAB — URINE CULTURE: Colony Count: >=100000

## 2014-10-16 NOTE — Telephone Encounter (Addendum)
Message copied by Erik Obey on Fri Oct 16, 2014 11:19 AM ------      Message from: Donnamae Jude      Created: Wed Oct 14, 2014  9:59 AM       Needs primary MD referral for abnormal labs including elevated cholesterol, mildly abnormal kidney function and elevated alkaline phophatase. ------  Advised patient of lab results she would like for [place] to refer her to primary care.

## 2014-10-16 NOTE — Telephone Encounter (Signed)
Dr. Kennon Rounds put in a referral for pt to establish with Dr. Danise Mina. Pt is a current pt of Dr. Gilford Rile and hasn't been seen in over a year. She would like to switch from Dr. Gilford Rile to Dr. Danise Mina. Will this switch be okay?

## 2014-10-19 NOTE — Telephone Encounter (Signed)
Spoke with pt on 10/19/14 to set her up for new visit with Dr. Darnell Level and pt stated she did not want to go to [facility] for her healthcare and is going back to her [facility] doctors.  Ended PCP 10/19/14 in [place]

## 2014-10-19 NOTE — Telephone Encounter (Signed)
Ok by me if ok by pcp.

## 2014-10-19 NOTE — Telephone Encounter (Signed)
Fine by me 

## 2014-10-21 ENCOUNTER — Encounter: Payer: Self-pay | Admitting: Family Medicine

## 2014-10-21 ENCOUNTER — Ambulatory Visit (INDEPENDENT_AMBULATORY_CARE_PROVIDER_SITE_OTHER): Payer: BC Managed Care – PPO | Admitting: Family Medicine

## 2014-10-21 VITALS — BP 111/65 | HR 76 | Wt 173.0 lb

## 2014-10-21 DIAGNOSIS — R319 Hematuria, unspecified: Secondary | ICD-10-CM | POA: Diagnosis not present

## 2014-10-21 LAB — POCT URINALYSIS DIPSTICK
Bilirubin, UA: NEGATIVE
Glucose, UA: NEGATIVE
KETONES UA: NEGATIVE
Nitrite, UA: NEGATIVE
PH UA: 6
Spec Grav, UA: 1.015
Urobilinogen, UA: NEGATIVE

## 2014-10-21 MED ORDER — FLUCONAZOLE 150 MG PO TABS: 150.0000 mg | ORAL_TABLET | Freq: Once | ORAL | Status: DC

## 2014-10-21 MED ORDER — CIPROFLOXACIN HCL 500 MG PO TABS: 500.0000 mg | ORAL_TABLET | Freq: Two times a day (BID) | ORAL | Status: DC

## 2014-10-21 NOTE — Progress Notes (Signed)
    Subjective:    Patient ID: Alejandra Hill is a 53 y.o. female presenting with urine check  on 10/21/2014  HPI: Here to f/u hematuria.  Previously seen with same at new exam and culture +ve for E. Coli.  Treated 7 d of Bactrim.  Symptoms have improved.  Still with blood in urine. Culture is sensitive to Bactrim but with high MIC.  Review of Systems  Constitutional: Negative for fever and chills.  Respiratory: Negative for shortness of breath.   Gastrointestinal: Negative for nausea, vomiting and diarrhea.  Skin: Negative for rash.      Objective:    BP 111/65  Pulse 76  Wt 173 lb (78.472 kg)  LMP 10/14/2011 Physical Exam  Constitutional: She is oriented to person, place, and time. She appears well-developed and well-nourished. No distress.  HENT:  Head: Normocephalic and atraumatic.  Eyes: No scleral icterus.  Neck: Neck supple.  Cardiovascular: Normal rate.   Pulmonary/Chest: Effort normal.  Abdominal: Soft.  Neurological: She is alert and oriented to person, place, and time.        Assessment & Plan:   Problem List Items Addressed This Visit     Unprioritized   Hematuria     Repeat urine culture--switch to Cipro--repeat u/a in 2 wks--if still with blood--then Urology referral    Relevant Medications      ciprofloxacin (CIPRO) tablet      fluconazole (DIFLUCAN) tablet 150 mg    Other Visit Diagnoses   Blood in urine    -  Primary    Relevant Orders       POCT Urinalysis Dipstick (Completed)       Urine Culture       Return in about 3 months (around 01/21/2015) for 2 wks for RN visit and U/A.

## 2014-10-21 NOTE — Assessment & Plan Note (Signed)
Repeat urine culture--switch to Cipro--repeat u/a in 2 wks--if still with blood--then Urology referral

## 2014-10-21 NOTE — Patient Instructions (Signed)

## 2014-10-22 LAB — URINE CULTURE
Colony Count: NO GROWTH
Organism ID, Bacteria: NO GROWTH

## 2014-10-26 ENCOUNTER — Encounter: Payer: Self-pay | Admitting: Family Medicine

## 2014-11-06 ENCOUNTER — Encounter: Payer: Self-pay | Admitting: Internal Medicine

## 2015-09-01 ENCOUNTER — Telehealth: Payer: Self-pay | Admitting: *Deleted

## 2015-09-01 NOTE — Telephone Encounter (Signed)
Review of chart showed pt did not have Hep B vaccine however she did have titers drawn which have been printed off and placed up front from pick up.  Message left on VM to return call.

## 2015-09-01 NOTE — Telephone Encounter (Signed)
Patient has requested a copy of her hep B vaccine that was taken at this office. Patient has a requested a call back for pick up time. -Thanks

## 2016-11-20 DIAGNOSIS — R350 Frequency of micturition: Secondary | ICD-10-CM | POA: Insufficient documentation

## 2016-11-20 DIAGNOSIS — R7303 Prediabetes: Secondary | ICD-10-CM | POA: Insufficient documentation

## 2016-11-21 DIAGNOSIS — R1030 Lower abdominal pain, unspecified: Secondary | ICD-10-CM | POA: Insufficient documentation

## 2017-01-01 DIAGNOSIS — M79671 Pain in right foot: Secondary | ICD-10-CM | POA: Insufficient documentation

## 2017-01-03 DIAGNOSIS — N185 Chronic kidney disease, stage 5: Secondary | ICD-10-CM | POA: Insufficient documentation

## 2017-01-03 DIAGNOSIS — N183 Chronic kidney disease, stage 3 unspecified: Secondary | ICD-10-CM | POA: Insufficient documentation

## 2017-04-20 DIAGNOSIS — N390 Urinary tract infection, site not specified: Secondary | ICD-10-CM | POA: Insufficient documentation

## 2018-03-26 ENCOUNTER — Telehealth: Payer: Self-pay | Admitting: Medical

## 2018-05-22 ENCOUNTER — Ambulatory Visit: Payer: Self-pay | Admitting: Internal Medicine

## 2018-06-04 IMAGING — CR DG HAND COMPLETE 3+V*R*
1 series · 3 of 3 positions shown · non-contrast
Comparison: None.

CLINICAL DATA: Slip and fall with pain and swelling.

EXAM:
RIGHT HAND - COMPLETE 3+ VIEW

[Series 1: dg hand complete right · 0.14mm/px · 3 of 3 slices shown]
[im 1/3]
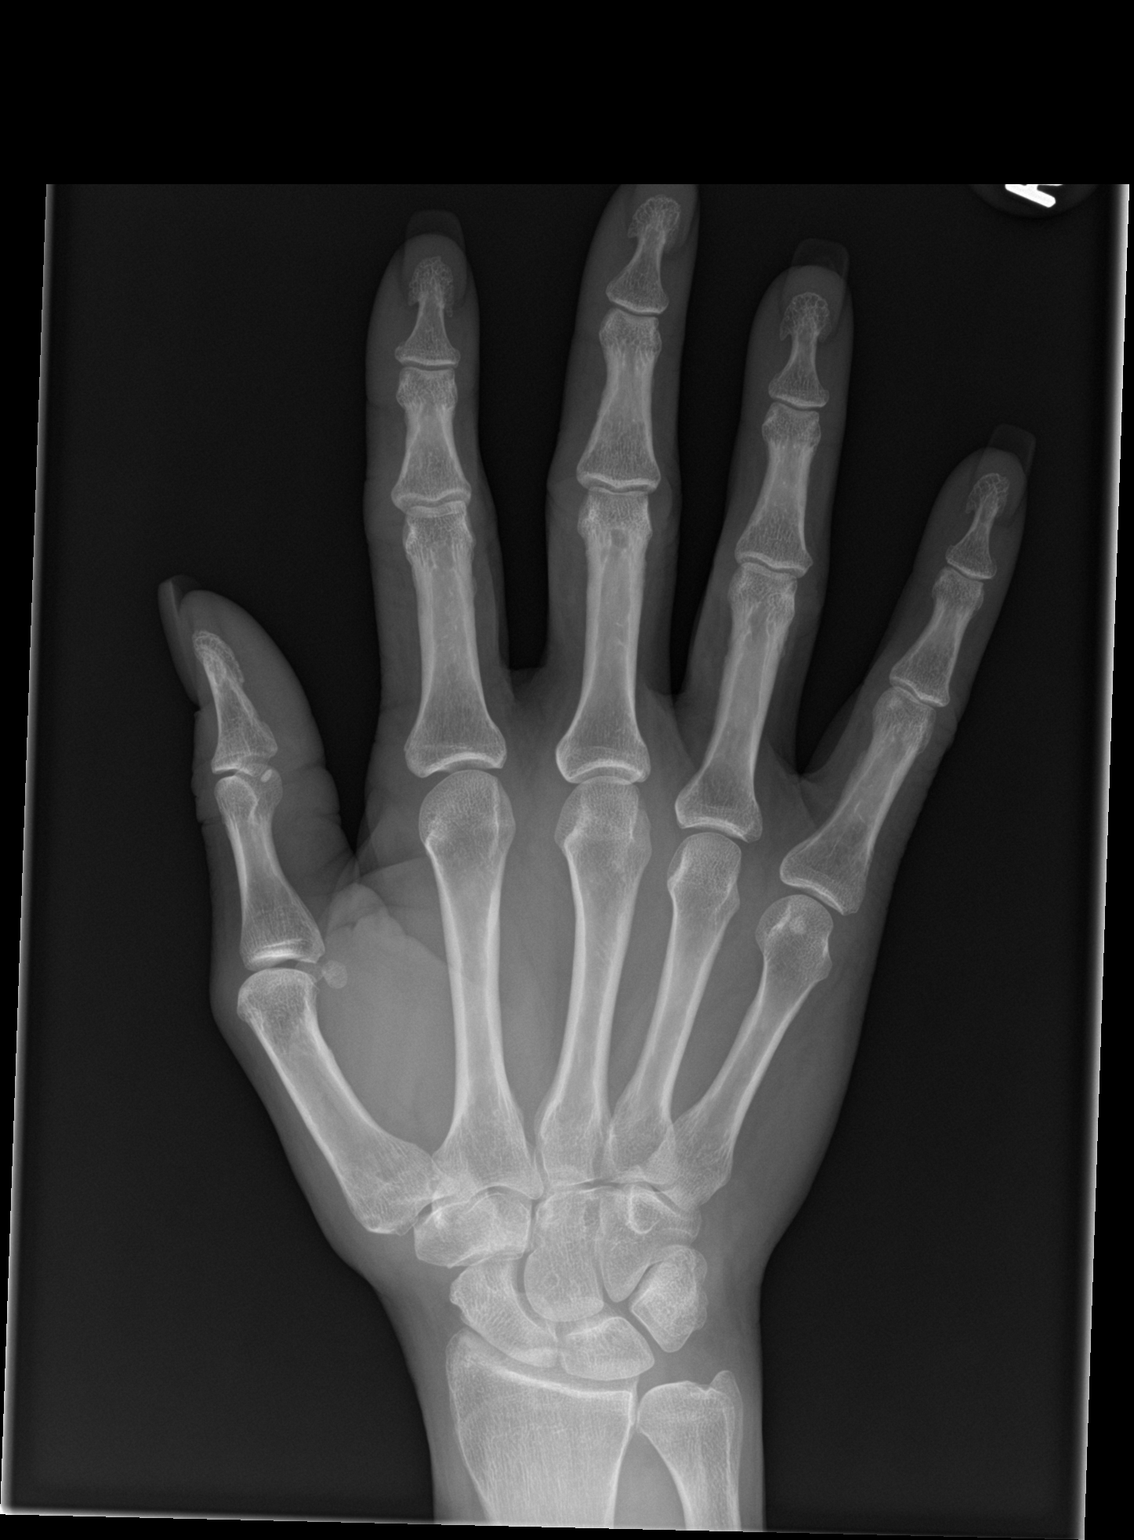
[im 2/3]
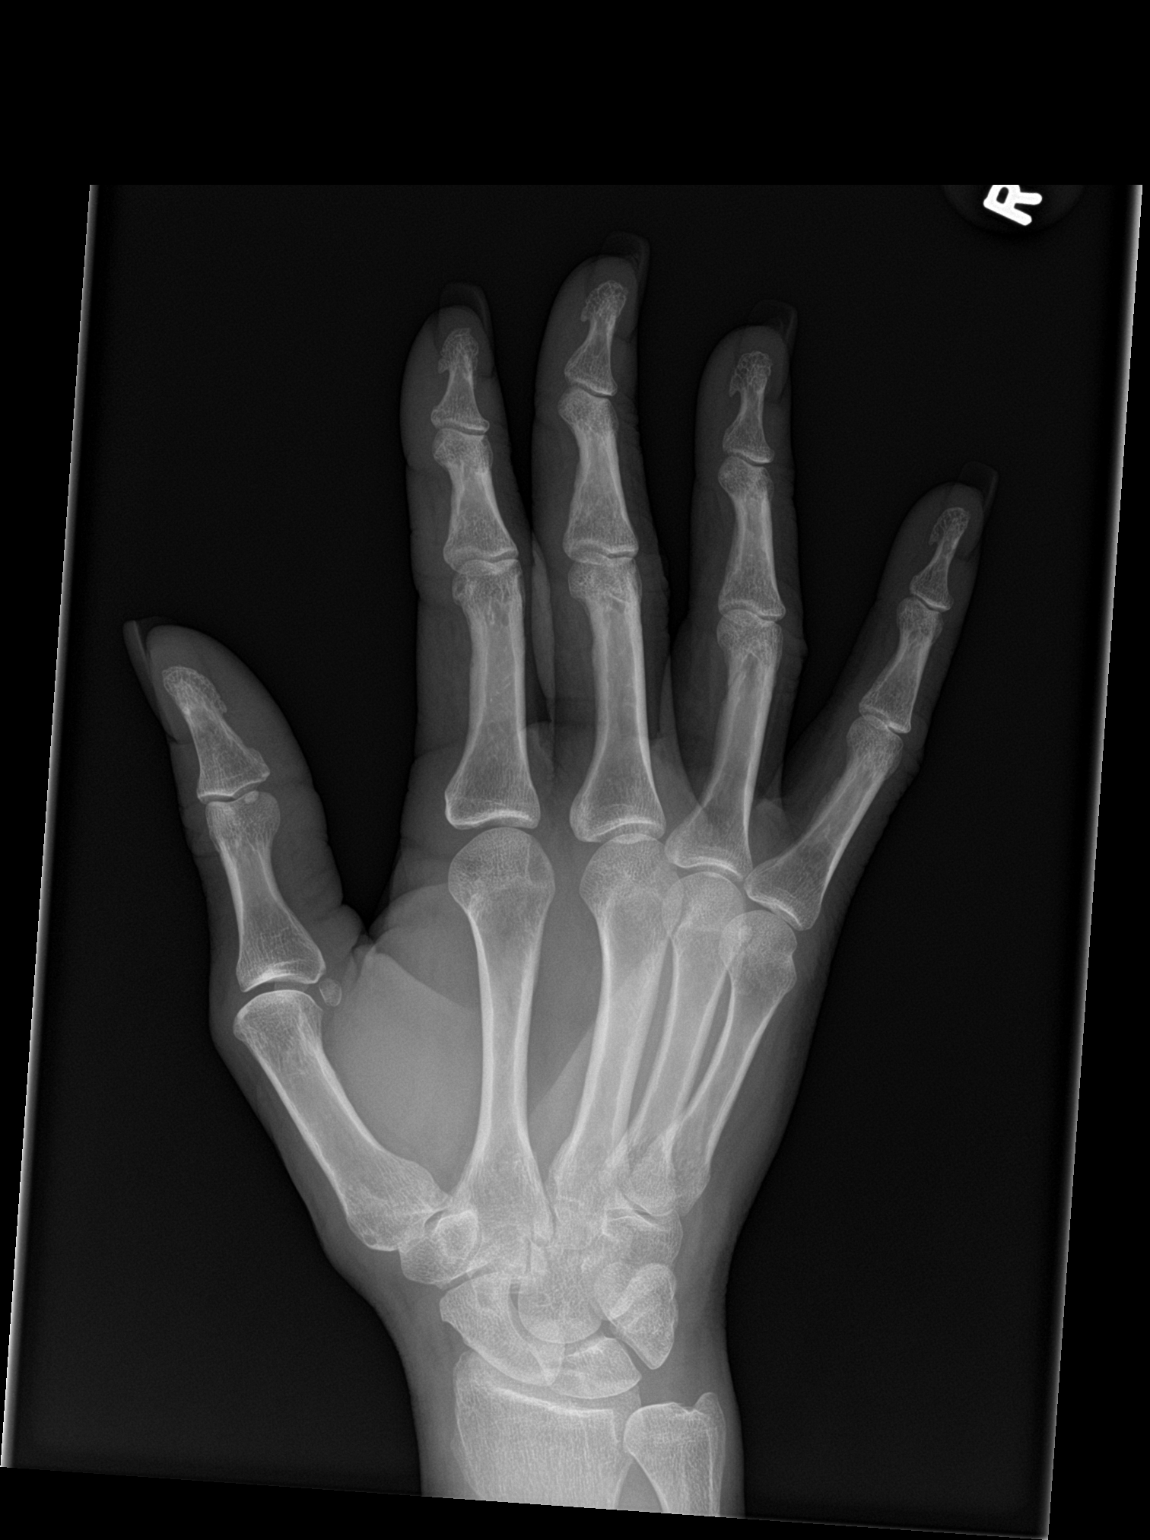
[im 3/3]
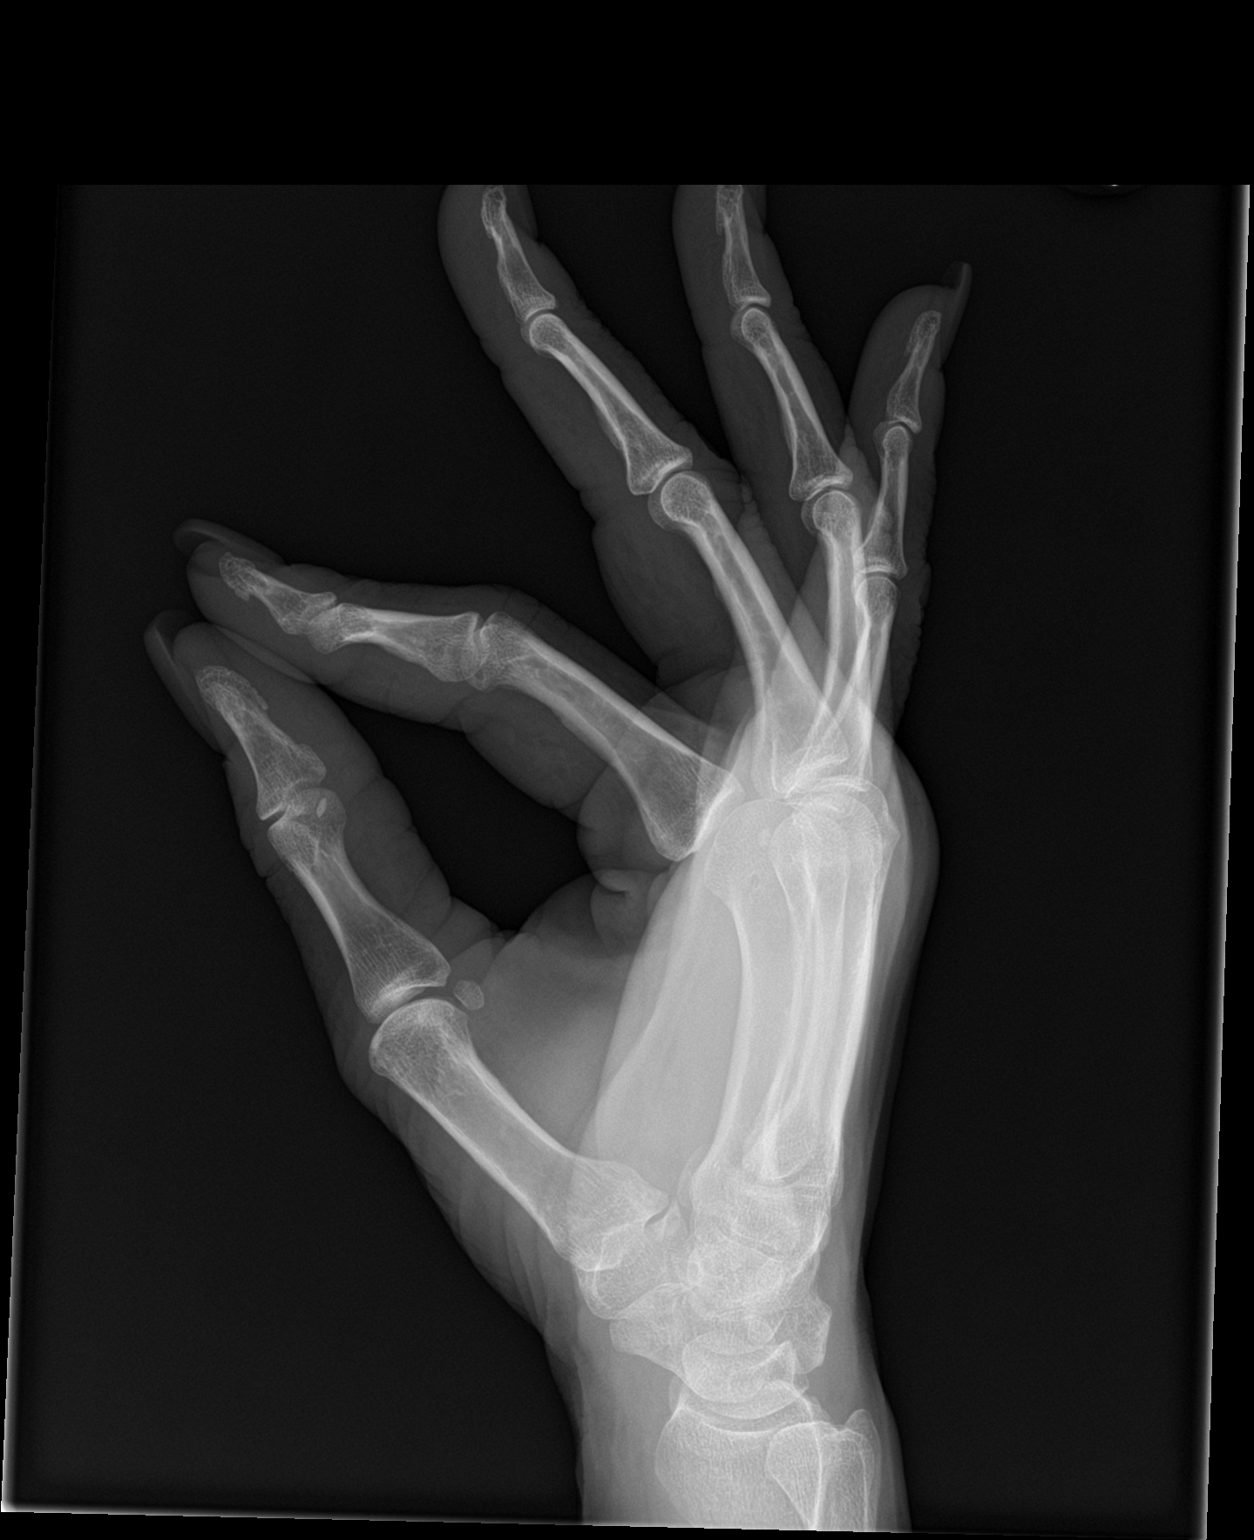

[3 of 3 positions shown; findings below may reference images not displayed]

FINDINGS: There is no evidence of fracture or dislocation. The alignment and
joint spaces are maintained. Few scattered carpal bone cysts
typically incidental. Soft tissues are unremarkable.
IMPRESSION: No fracture or dislocation of the right hand.

## 2018-12-07 ENCOUNTER — Other Ambulatory Visit: Payer: Self-pay

## 2018-12-07 ENCOUNTER — Emergency Department
Admission: EM | Admit: 2018-12-07 | Discharge: 2018-12-07 | Disposition: A | Discharge status: Home / Self Care | Payer: BLUE CROSS/BLUE SHIELD | Attending: Emergency Medicine | Admitting: Emergency Medicine

## 2018-12-07 ENCOUNTER — Emergency Department: Payer: BLUE CROSS/BLUE SHIELD

## 2018-12-07 ENCOUNTER — Encounter: Payer: Self-pay | Admitting: Emergency Medicine

## 2018-12-07 DIAGNOSIS — Z79899 Other long term (current) drug therapy: Secondary | ICD-10-CM | POA: Diagnosis not present

## 2018-12-07 DIAGNOSIS — M79641 Pain in right hand: Secondary | ICD-10-CM | POA: Diagnosis not present

## 2018-12-07 NOTE — ED Triage Notes (Signed)
Pt in with co right hand pain, states fell this am.

## 2018-12-07 NOTE — ED Provider Notes (Signed)
[facility] Emergency Department Provider Note   ____________________________________________   First MD Initiated Contact with Patient 12/07/18 0131     (approximate)  I have reviewed the triage vital signs and the nursing notes.   HISTORY  Chief Complaint Hand Pain    HPI Alejandra Hill is a 57 y.o. female who reports she fell on her outstretched hand earlier today.  She is having some pain in the MCP joint of the long finger and some swelling.  Otherwise she feels well.  X-ray shows no fracture.  She has good range of motion of the finger.  There is no bony tenderness on the palmar surface.   Past Medical History:  Diagnosis Date  . Anemia   . Overactive bladder     Patient Active Problem List   Diagnosis Date Noted  . Fibroid uterus 10/13/2014  . Hematuria 10/13/2014  . Anemia, iron deficiency 05/15/2013  . Hematochezia 05/15/2013  . Overactive bladder 11/13/2012  . Elevated LFTs 11/13/2012  . Hot flashes 05/09/2012    Past Surgical History:  Procedure Laterality Date  . BREAST SURGERY     fibrocystic  . LAPAROTOMY     ectopic    Prior to Admission medications   Medication Sig Start Date End Date Taking? Authorizing Provider  calcium citrate-vitamin D (CITRACAL+D) 315-200 MG-UNIT per tablet Take by mouth.    [provider]  ciprofloxacin (CIPRO) 500 MG tablet Take 1 tablet (500 mg total) by mouth 2 (two) times daily. 10/21/14   Donnamae Jude, MD  ferrous gluconate (FERGON) 240 (27 FE) MG tablet Take 240 mg by mouth 2 (two) times daily.      [provider]  ferrous sulfate (IRON SUPPLEMENT) 325 (65 FE) MG tablet Take 325 mg by mouth.    [provider]  fluconazole (DIFLUCAN) 150 MG tablet Take 1 tablet (150 mg total) by mouth once. 10/21/14   Donnamae Jude, MD  Multiple Vitamin (MULTI-VITAMINS) TABS Take by mouth.    [provider]    Allergies Patient has no known allergies.  Family  History  Problem Relation Age of Onset  . Cancer Mother        pancreatic  . Hypertension Mother   . Cancer Father        lung  . Hypertension Father   . Cancer Maternal Grandmother        bone  . Cancer Paternal Grandmother        breast    Social History Social History   Tobacco Use  . Smoking status: Never Smoker  Substance Use Topics  . Alcohol use: Yes  . Drug use: No    Review of Systems  Constitutional: No fever/chills Eyes: No visual changes. Musculoskeletal: Negative for back pain. Skin: Negative for rash. Neurological: Negative for  focal weakness   ____________________________________________   PHYSICAL EXAM:  VITAL SIGNS: ED Triage Vitals  Enc Vitals Group     BP 12/07/18 0035 (!) 147/75     Pulse Rate 12/07/18 0035 71     Resp 12/07/18 0035 17     Temp 12/07/18 0035 97.8 F (36.6 C)     Temp Source 12/07/18 0035 Oral     SpO2 12/07/18 0035 98 %     Weight 12/07/18 0020 165 lb (74.8 kg)     Height 12/07/18 0020 5\' 4"  (1.626 m)     Head Circumference --      Peak Flow --  Pain Score 12/07/18 0020 3     Pain Loc --      Pain Edu? --      Excl. in [place]? --     Constitutional: Alert and oriented. Well appearing and in no acute distress. Eyes: Conjunctivae are normal.  Head: Atraumatic. Nose: No congestion/rhinnorhea. Mouth/Throat: Mucous membranes are moist.   Neck: No stridor. Cardiovascular:   Good peripheral circulation. Respiratory: Normal respiratory effort.  No retractions. Musculoskeletal: Right hand with swelling around the MCP of the long finger.  Joint has full range of motion there is no bony tenderness on the palmar surface normal capillary refill distally Neurologic:  Normal speech and language. No gross focal neurologic deficits are appreciated.  Skin:  Skin is warm, dry and intact. No rash noted. Psychiatric: Mood and affect are normal. Speech and behavior are normal.  ____________________________________________    LABS (all labs ordered are listed, but only abnormal results are displayed)  Labs Reviewed - No data to display ____________________________________________  EKG  _______________________________________  RADIOLOGY  ED MD interpretation 3 read by radiology reviewed by me shows no fracture or other acute pathology  Official radiology report(s): Dg Hand Complete Right  Result Date: 12/07/2018 CLINICAL DATA:  Slip and fall with pain and swelling. EXAM: RIGHT HAND - COMPLETE 3+ VIEW COMPARISON:  None. FINDINGS: There is no evidence of fracture or dislocation. The alignment and joint spaces are maintained. Few scattered carpal bone cysts typically incidental. Soft tissues are unremarkable. IMPRESSION: No fracture or dislocation of the right hand. Electronically Signed   By: Keith Rake M.D.   On: 12/07/2018 01:00    ____________________________________________   PROCEDURES  Procedure(s) performed:   Procedures  Critical Care performed:   ____________________________________________   INITIAL IMPRESSION / ASSESSMENT AND PLAN / ED COURSE  Patient with strain of the MCP joint.  Advil or Tylenol for pain heat or ice whichever makes it feel better but not while you are asleep.  I let her know that occasionally hands feet or ribs will crack and not show up right away.  She will return if she is any worse or not any better in a week.      ____________________________________________   FINAL CLINICAL IMPRESSION(S) / ED DIAGNOSES  Final diagnoses:  Hand pain, right     ED Discharge Orders    None       Note:  This document was prepared using Dragon voice recognition software and may include unintentional dictation errors.    Nena Polio, MD 12/07/18 [phone]

## 2018-12-07 NOTE — Discharge Instructions (Addendum)
Use Advil or Tylenol if needed for pain.  You can also use heat or ice whichever makes it feel better about 20 minutes every hour or so.  Be careful not to fall asleep with either 1 of these on your hand that she can get frostbite or burns.  Return for any increasing pain swelling or other problems.  Also return if you are not any better in a week or follow-up with your regular doctor.

## 2018-12-07 NOTE — ED Notes (Signed)
Patient states that she tripped and fell this morning. Patient with pain and swelling to right hand.

## 2018-12-07 NOTE — ED Notes (Signed)
Reviewed discharge instructions, follow-up care, and OTC pain relievers with patient. Patient verbalized understanding of all information reviewed. Patient stable, with no distress noted at this time.

## 2019-01-06 DIAGNOSIS — K769 Liver disease, unspecified: Secondary | ICD-10-CM | POA: Insufficient documentation

## 2019-01-06 DIAGNOSIS — R590 Localized enlarged lymph nodes: Secondary | ICD-10-CM | POA: Insufficient documentation

## 2019-01-06 DIAGNOSIS — Z124 Encounter for screening for malignant neoplasm of cervix: Secondary | ICD-10-CM | POA: Insufficient documentation

## 2020-01-11 DIAGNOSIS — Z91199 Patient's noncompliance with other medical treatment and regimen due to unspecified reason: Secondary | ICD-10-CM | POA: Insufficient documentation

## 2020-01-11 DIAGNOSIS — Z9119 Patient's noncompliance with other medical treatment and regimen: Secondary | ICD-10-CM | POA: Insufficient documentation

## 2020-01-29 ENCOUNTER — Encounter: Payer: Self-pay | Admitting: Oncology

## 2020-01-29 NOTE — Progress Notes (Signed)
Patient contacted to review and update chart prior to visit on 2/8. Pt has seen Dr. Grayland Ormond in 2014. Pt reports having to take COVID test on 2/5 because it is required for work. No signs or symptoms. No concerns voiced.

## 2020-02-02 ENCOUNTER — Inpatient Hospital Stay: Payer: BC Managed Care – PPO

## 2020-02-02 ENCOUNTER — Other Ambulatory Visit: Payer: Self-pay

## 2020-02-02 ENCOUNTER — Encounter: Payer: Self-pay | Admitting: Oncology

## 2020-02-02 ENCOUNTER — Inpatient Hospital Stay: Payer: BC Managed Care – PPO | Attending: Oncology | Admitting: Oncology

## 2020-02-02 VITALS — BP 161/74 | HR 78 | Temp 97.0°F | Resp 18 | Wt 175.5 lb

## 2020-02-02 DIAGNOSIS — N390 Urinary tract infection, site not specified: Secondary | ICD-10-CM | POA: Diagnosis not present

## 2020-02-02 DIAGNOSIS — R591 Generalized enlarged lymph nodes: Secondary | ICD-10-CM

## 2020-02-02 DIAGNOSIS — N184 Chronic kidney disease, stage 4 (severe): Secondary | ICD-10-CM | POA: Diagnosis not present

## 2020-02-02 DIAGNOSIS — K769 Liver disease, unspecified: Secondary | ICD-10-CM | POA: Insufficient documentation

## 2020-02-02 DIAGNOSIS — R319 Hematuria, unspecified: Secondary | ICD-10-CM | POA: Insufficient documentation

## 2020-02-02 DIAGNOSIS — D649 Anemia, unspecified: Secondary | ICD-10-CM | POA: Insufficient documentation

## 2020-02-02 LAB — CBC WITH DIFFERENTIAL/PLATELET
Abs Immature Granulocytes: 0.02 10*3/uL (ref 0.00–0.07)
Basophils Absolute: 0.1 10*3/uL (ref 0.0–0.1)
Basophils Relative: 1 %
Eosinophils Absolute: 0.1 10*3/uL (ref 0.0–0.5)
Eosinophils Relative: 1 %
HCT: 31.8 % — ABNORMAL LOW (ref 36.0–46.0)
Hemoglobin: 9.4 g/dL — ABNORMAL LOW (ref 12.0–15.0)
Immature Granulocytes: 0 %
Lymphocytes Relative: 26 %
Lymphs Abs: 1.9 10*3/uL (ref 0.7–4.0)
MCH: 28 pg (ref 26.0–34.0)
MCHC: 29.6 g/dL — ABNORMAL LOW (ref 30.0–36.0)
MCV: 94.6 fL (ref 80.0–100.0)
Monocytes Absolute: 0.7 10*3/uL (ref 0.1–1.0)
Monocytes Relative: 9 %
Neutro Abs: 4.6 10*3/uL (ref 1.7–7.7)
Neutrophils Relative %: 63 %
Platelets: 308 10*3/uL (ref 150–400)
RBC: 3.36 MIL/uL — ABNORMAL LOW (ref 3.87–5.11)
RDW: 13.4 % (ref 11.5–15.5)
WBC: 7.2 10*3/uL (ref 4.0–10.5)
nRBC: 0 % (ref 0.0–0.2)

## 2020-02-02 LAB — HEPATITIS PANEL, ACUTE
HCV Ab: NONREACTIVE
Hep A IgM: NONREACTIVE
Hep B C IgM: NONREACTIVE
Hepatitis B Surface Ag: NONREACTIVE

## 2020-02-02 LAB — TECHNOLOGIST SMEAR REVIEW: Plt Morphology: ADEQUATE

## 2020-02-02 LAB — LACTATE DEHYDROGENASE: LDH: 143 U/L (ref 98–192)

## 2020-02-02 LAB — SEDIMENTATION RATE: Sed Rate: 85 mm/hr — ABNORMAL HIGH (ref 0–30)

## 2020-02-02 NOTE — Progress Notes (Addendum)
Hematology/Oncology Consult note [facility] [phone] [phone]   Patient Care Team: System, Pcp Not In as PCP - General  REFERRING PROVIDER: Grayland O[facility]ond [facility],*  CHIEF COMPLAINTS/REASON FOR VISIT:  Evaluation of lymphadenopathy  HISTORY OF PRESENTING ILLNESS:   Alejandra Hill is a  59 y.o.  female with PMH listed below was seen in consultation at the request of  Alejandra Hill,*  for evaluation of lymphadenopathy Patient previously following with Alejandra Hill for most of her care.  She recently wants to move her care locally. She was seen by primary care provider Alejandra Hill recently. Extensive medical records review via care everywhere was performed by me. She has a history of recurrent UTI, chronic dysuria and urinary frequency.  She was seen by urology previously. She also had a history of retroperitoneal lymphadenopathy first was on CT scan 03/05/2018 At that time CT showed multiple enlarged retroperitoneal lymph nodes which are suspicious for malignancy such as lymphoma.  Retroperitoneal lymph nodes are amendable to percutaneous biopsy.  Nonspecific 1.2 cm right hepatic lobe lesion. 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  Lesion was incompletely evaluated on a noncontrast study.  Differential is hemangioma due to stability and precontrast T2 signal. Patient was seen by Alejandra Hill hematology oncology on 01/24/2019.  Was recommended to have a follow-up MRI July 2020.  Patient did not have image follow-up done since then.  Patient denies any unintentional weight loss, night sweats, fatigue.  Fever or chills. She is currently on a course of antibiotics for dysuria/urgency treating for UTI.  Her urine on 01/27/2020 showed large amount of blood, positive leukocyte Esterace.  Patient has chronic kidney disease, recent creatinine was 3,  estimated GFR 19.  She was last seen by her Alejandra Hill nephrology Dr. Fredirick Maudlin on 06/04/2019.  Possible need of renal biopsy was discussed at that time. Patient's work-up were positive for a low positive titer ANA and ANCA titers. SPEP negative.  Recurrent UTI, previously seen by [facility] 11/08/2017  Review of Systems  Constitutional: Negative for appetite change, chills, fatigue and fever.  HENT:   Negative for hearing loss and voice change.   Eyes: Negative for eye problems.  Respiratory: Negative for chest tightness and cough.   Cardiovascular: Negative for chest pain.  Gastrointestinal: Negative for abdominal distention, abdominal pain and blood in stool.  Endocrine: Negative for hot flashes.  Genitourinary: Positive for dysuria and frequency. Negative for difficulty urinating.   Musculoskeletal: Negative for arthralgias.  Skin: Negative for itching and rash.  Neurological: Negative for extremity weakness.  Hematological: Negative for adenopathy.  Psychiatric/Behavioral: Negative for confusion.    MEDICAL HISTORY:  Past Medical History:  Diagnosis Date  . Allergy   . Anemia   . Overactive bladder     SURGICAL HISTORY: Past Surgical History:  Procedure Laterality Date  . BREAST SURGERY     fibrocystic  . LAPAROTOMY     ectopic    SOCIAL HISTORY: Social History   Socioeconomic History  . Marital status: Single    Spouse name: Not on file  . Number of children: Not on file  . Years of education: Not on file  . Highest education level: Not on file  Occupational History  . Not on file  Tobacco Use  . Smoking status: Never Smoker  Substance and Sexual Activity  . Alcohol use: Yes  . Drug use: No  .  Sexual activity: Never    Birth control/protection: None  Other Topics Concern  . Not on file  Social History Narrative   Lives alone in [place]. Works as Sports coach.   Social Determinants of Health   Financial Resource Strain:   . Difficulty of Paying Living  Expenses: Not on file  Food Insecurity:   . Worried About Charity fundraiser in the Last Year: Not on file  . Ran Out of Food in the Last Year: Not on file  Transportation Needs:   . Lack of Transportation (Medical): Not on file  . Lack of Transportation (Non-Medical): Not on file  Physical Activity:   . Days of Exercise per Week: Not on file  . Minutes of Exercise per Session: Not on file  Stress:   . Feeling of Stress : Not on file  Social Connections:   . Frequency of Communication with Friends and Family: Not on file  . Frequency of Social Gatherings with Friends and Family: Not on file  . Attends Religious Services: Not on file  . Active Member of Clubs or Organizations: Not on file  . Attends Archivist Meetings: Not on file  . Marital Status: Not on file  Intimate Partner Violence:   . Fear of Current or Ex-Partner: Not on file  . Emotionally Abused: Not on file  . Physically Abused: Not on file  . Sexually Abused: Not on file    FAMILY HISTORY: Family History  Problem Relation Age of Onset  . Cancer Mother        pancreatic  . Hypertension Mother   . Cancer Father        lung  . Hypertension Father   . Throat cancer Father   . Cancer Maternal Grandmother        bone  . Cancer Paternal Grandmother        breast    ALLERGIES:  has No Known Allergies.  MEDICATIONS:  Current Outpatient Medications  Medication Sig Dispense Refill  . calcium citrate-vitamin D (CITRACAL+D) 315-200 MG-UNIT per tablet Take by mouth.    . cephALEXin (KEFLEX) 500 MG capsule Take by mouth.    . ferrous sulfate (IRON SUPPLEMENT) 325 (65 FE) MG tablet Take 325 mg by mouth.    [name] (GARLIC/EPA PO) Take by mouth.    . Multiple Vitamins-Minerals (ALIVE WOMENS ENERGY) TABS Take by mouth.    . ciprofloxacin (CIPRO) 500 MG tablet Take 1 tablet (500 mg total) by mouth 2 (two) times daily. (Patient not taking: Reported on 01/29/2020) 20 tablet 0  . ferrous gluconate  (FERGON) 240 (27 FE) MG tablet Take 240 mg by mouth 2 (two) times daily.      . fluconazole (DIFLUCAN) 150 MG tablet Take 1 tablet (150 mg total) by mouth once. (Patient not taking: Reported on 01/29/2020) 2 tablet 2  . Multiple Vitamin (MULTI-VITAMINS) TABS Take by mouth.     No current facility-administered medications for this visit.     PHYSICAL EXAMINATION: ECOG PERFORMANCE STATUS: 1 - Symptomatic but completely ambulatory Vitals:   02/02/20 0937  BP: (!) 161/74  Pulse: 78  Resp: 18  Temp: (!) 97 F (36.1 C)   Filed Weights   02/02/20 0937  Weight: 175 lb 8 oz (79.6 kg)    Physical Exam Constitutional:      General: She is not in acute distress. HENT:     Head: Normocephalic and atraumatic.  Eyes:     General: No scleral icterus. Cardiovascular:  Rate and Rhythm: Normal rate and regular rhythm.     Heart sounds: Normal heart sounds.  Pulmonary:     Effort: Pulmonary effort is normal. No respiratory distress.     Breath sounds: No wheezing.  Abdominal:     General: Bowel sounds are normal. There is no distension.     Palpations: Abdomen is soft.  Musculoskeletal:        General: No deformity. Normal range of motion.     Cervical back: Normal range of motion and neck supple.  Skin:    General: Skin is warm and dry.     Findings: No erythema or rash.  Neurological:     Mental Status: She is alert and oriented to person, place, and time. Mental status is at baseline.     Cranial Nerves: No cranial nerve deficit.     Coordination: Coordination normal.  Psychiatric:        Mood and Affect: Mood normal.     LABORATORY DATA:  I have reviewed the data as listed Lab Results  Component Value Date   WBC 7.2 02/02/2020   HGB 9.4 (L) 02/02/2020   HCT 31.8 (L) 02/02/2020   MCV 94.6 02/02/2020   PLT 308 02/02/2020   No results for input(s): NA, K, CL, CO2, GLUCOSE, BUN, CREATININE, CALCIUM, GFRNONAA, GFRAA, PROT, ALBUMIN, AST, ALT, ALKPHOS, BILITOT, BILIDIR,  IBILI in the last 8760 hours. Iron/TIBC/Ferritin/ %Sat    Component Value Date/Time   IRON 56 12/03/2012 1223   TIBC 356 12/03/2012 1223   FERRITIN 97 12/03/2012 1223   IRONPCTSAT 16 12/03/2012 1223      RADIOGRAPHIC STUDIES: I have personally reviewed the radiological images as listed and agreed with the findings in the report. No results found.    ASSESSMENT & PLAN:  1. Lymphadenopathy   2. Liver lesion   3. Stage 4 chronic kidney disease ([place])   4. Urinary tract infection with hematuria, site unspecified    #Previous medical records were reviewed and discussed with patient. Patient has lost to follow-up on her liver lesion and lymphadenopathy and liver lesions. Given her severe chronic kidney disease, I recommend to proceed CT chest abdomen pelvis without contrast first.  Additional imaging may be required, MRI abdomen versus a PET scan.   Utility of MRI without contrast is limited in evaluating her liver lesion. Check CBC, LDH, hepatitis panel, peripheral blood flow cytometry.  Check AFP Check multiple myeloma panel, ANA, ESR.  Chronic kidney disease, recent creatinine was 3 at the primary care provider's office.  Pending above work-up. I recommend patient to continue follow-up with nephrologist for follow-up of her kidney function and evaluation for need of dialysis. Anemia, etiology unknown.  Probably due to chronic kidney disease. Monitor hemoglobin.  Recurrent UTI, finished course of antibiotics per instructed by primary care provider.  Patient is established care with local urologist.  Orders Placed This Encounter  Procedures  . CT Chest Wo Contrast    Standing Status:   Future    Standing Expiration Date:   02/01/2021    Order Specific Question:   ** REASON FOR EXAM (FREE TEXT)    Answer:   lymphadenopathy    Order Specific Question:   Preferred imaging location?    Answer[facility]    [facility]    Order Specific Question:   Radiology Contrast Protocol - do NOT  remove file path    Answer:   _0 [email or web address]  . CT Abdomen Pelvis Wo Contrast    Standing Status:  Future    Standing Expiration Date:   02/01/2021    Order Specific Question:   ** REASON FOR EXAM (FREE TEXT)    Answer:   retroperitoneal lymphadenopathy    Order Specific Question:   Preferred imaging location?    Answer:   [facility]    Order Specific Question:   Is Oral Contrast requested for this exam?    Answer:   Yes, Per Radiology protocol    Order Specific Question:   Radiology Contrast Protocol - do NOT remove file path    Answer:   _0 [email or web address]  . ANA w/Reflex    Standing Status:   Future    Number of Occurrences:   1    Standing Expiration Date:   02/01/2021  . CBC with Differential/Platelet    Standing Status:   Future    Number of Occurrences:   1    Standing Expiration Date:   02/01/2021  . Technologist smear review    Standing Status:   Future    Number of Occurrences:   1    Standing Expiration Date:   02/01/2021  . Flow cytometry panel-leukemia/lymphoma work-up    Standing Status:   Future    Number of Occurrences:   1    Standing Expiration Date:   02/01/2021  . Lactate dehydrogenase    Standing Status:   Future    Number of Occurrences:   1    Standing Expiration Date:   02/01/2021  . Hepatitis panel, acute    Standing Status:   Future    Number of Occurrences:   1    Standing Expiration Date:   02/01/2021  . Sedimentation rate    Standing Status:   Future    Number of Occurrences:   1    Standing Expiration Date:   02/01/2021  . Multiple Myeloma Panel (SPEP&IFE w/QIG)    Standing Status:   Future    Number of Occurrences:   1    Standing Expiration Date:   02/01/2021  . Kappa/lambda light chains    Standing Status:   Future    Number of Occurrences:   1    Standing Expiration Date:   02/01/2021  . AFP tumor marker    Standing Status:   Future    Number of Occurrences:   1    Standing Expiration  Date:   02/01/2021    All questions were answered. The patient knows to call the clinic with any problems questions or concerns.  cc Venetia Maxon, Rolanda Jay,*    Return of visit: To be determined Thank you for this kind referral and the opportunity to participate in the care of this patient. A copy of today's note is routed to referring provider    Earlie Server, MD, PhD Hematology Oncology [facility] Pager- [phone] 02/02/2020  Addendum CT images were reviewed. No significant retroperitoneal lymphadenopathy and periaortic node.  New left hydronephrosis/hydroureter is new finding and I recommend patient to follow up with Dr. Junious Silk She can followup with me in 1 year, check cbc and CMP  Earlie Server

## 2020-02-04 LAB — MULTIPLE MYELOMA PANEL, SERUM
Albumin SerPl Elph-Mcnc: 3.4 g/dL (ref 2.9–4.4)
Albumin/Glob SerPl: 0.9 (ref 0.7–1.7)
Alpha 1: 0.2 g/dL (ref 0.0–0.4)
Alpha2 Glob SerPl Elph-Mcnc: 0.8 g/dL (ref 0.4–1.0)
B-Globulin SerPl Elph-Mcnc: 1 g/dL (ref 0.7–1.3)
Gamma Glob SerPl Elph-Mcnc: 2 g/dL — ABNORMAL HIGH (ref 0.4–1.8)
Globulin, Total: 4.1 g/dL — ABNORMAL HIGH (ref 2.2–3.9)
IgA: 51 mg/dL — ABNORMAL LOW (ref 87–352)
IgG (Immunoglobin G), Serum: 2050 mg/dL — ABNORMAL HIGH (ref 586–1602)
IgM (Immunoglobulin M), Srm: 74 mg/dL (ref 26–217)
Total Protein ELP: 7.5 g/dL (ref 6.0–8.5)

## 2020-02-05 LAB — COMP PANEL: LEUKEMIA/LYMPHOMA

## 2020-02-05 LAB — KAPPA/LAMBDA LIGHT CHAINS
Kappa free light chain: 101.7 mg/L — ABNORMAL HIGH (ref 3.3–19.4)
Kappa, lambda light chain ratio: 1.5 (ref 0.26–1.65)
Lambda free light chains: 67.9 mg/L — ABNORMAL HIGH (ref 5.7–26.3)

## 2020-02-05 LAB — AFP TUMOR MARKER: AFP, Serum, Tumor Marker: 2 ng/mL (ref 0.0–8.3)

## 2020-02-05 LAB — ANA W/REFLEX: Anti Nuclear Antibody (ANA): NEGATIVE

## 2020-02-06 ENCOUNTER — Other Ambulatory Visit: Payer: Self-pay | Admitting: Oncology

## 2020-02-09 ENCOUNTER — Telehealth: Payer: Self-pay

## 2020-02-09 NOTE — Telephone Encounter (Signed)
Patient contacted this morning to let her know that her CT scheduled for tomorrow is cancelled due to authorization. Pt voiced understanding. Notifed her that we will contact her once a plan is in place.

## 2020-02-10 ENCOUNTER — Ambulatory Visit: Payer: BC Managed Care – PPO

## 2020-02-12 ENCOUNTER — Telehealth: Payer: Self-pay

## 2020-02-12 NOTE — Telephone Encounter (Signed)
Radiologist were unable to review patient on tumor board today due to previous scan images not being uploaded in system. Patient will be presented on next tumor board meeting and then Dr. Tasia Catchings will order additional testing. Patient has been notified.

## 2020-02-19 ENCOUNTER — Other Ambulatory Visit: Payer: Self-pay | Admitting: Oncology

## 2020-02-19 ENCOUNTER — Other Ambulatory Visit: Payer: BC Managed Care – PPO

## 2020-02-19 ENCOUNTER — Telehealth: Payer: Self-pay

## 2020-02-19 NOTE — Telephone Encounter (Signed)
-----   Message from Earlie Server, MD sent at 02/19/2020  2:36 PM EST ----- Her case was discussed on Tumor board. Images reviewed. Please let her know that I will obtain CT abdomen pelvis wo contrast. Please arrange. Thanks. Follow up TBD.

## 2020-02-19 NOTE — Progress Notes (Signed)
Tumor Board Documentation  Alejandra Hill was presented by Dr Tasia Catchings at our Tumor Board on 02/19/2020, which included representatives from medical oncology, radiation oncology, navigation, pathology, radiology, surgical, surgical oncology, internal medicine, pharmacy, genetics, pulmonology, palliative care, research.  Alejandra Hill currently presents as a new patient, for discussion with history of the following treatments: active survellience.  Additionally, we reviewed previous medical and familial history, history of present illness, and recent lab results along with all available histopathologic and imaging studies. The tumor board considered available treatment options and made the following recommendations: Additional screening Non Contrast CT Scan  The following procedures/referrals were also placed: No orders of the defined types were placed in this encounter.   Clinical Trial Status: not discussed   Staging used: Not Applicable  National site-specific guidelines   were discussed with respect to the case.  Tumor board is a meeting of clinicians from various specialty areas who evaluate and discuss patients for whom a multidisciplinary approach is being considered. Final determinations in the plan of care are those of the provider(s). The responsibility for follow up of recommendations given during tumor board is that of the provider.   Today's extended care, comprehensive team conference, Alejandra Hill was not present for the discussion and was not examined.   Multidisciplinary Tumor Board is a multidisciplinary case peer review process.  Decisions discussed in the Multidisciplinary Tumor Board reflect the opinions of the specialists present at the conference without having examined the patient.  Ultimately, treatment and diagnostic decisions rest with the primary provider(s) and the patient.

## 2020-02-19 NOTE — Telephone Encounter (Signed)
Please schedule CT and notify pt of appt time/date. She is aware and will be waiting for call. Thanks

## 2020-02-20 ENCOUNTER — Telehealth: Payer: Self-pay | Admitting: *Deleted

## 2020-02-20 NOTE — Telephone Encounter (Signed)
CT has been scheduled as requested. A detailed message was left on pt vmail making her aware of the CT location,date and time NPO 4 hrs prior and must pick up prep before scheduled 02/25/20 date.

## 2020-02-20 NOTE — Telephone Encounter (Signed)
Done..  CT has been scheduled as requested. A detailed message was left on pt vmail making her aware of the CT location,date and time NPO 4 hrs prior and must pick up prep before scheduled 02/25/20 date.

## 2020-02-25 ENCOUNTER — Ambulatory Visit
Admission: RE | Admit: 2020-02-25 | Discharge: 2020-02-25 | Disposition: A | Discharge status: Home / Self Care | Payer: BC Managed Care – PPO | Source: Ambulatory Visit | Attending: Oncology | Admitting: Oncology

## 2020-02-25 ENCOUNTER — Other Ambulatory Visit: Payer: Self-pay

## 2020-02-25 DIAGNOSIS — R591 Generalized enlarged lymph nodes: Secondary | ICD-10-CM | POA: Diagnosis present

## 2020-02-25 IMAGING — CT CT ABD-PELV W/O CM
1 series · 1 of 1 positions shown · non-contrast
Comparison: None
COMPARISON: None

Addendum:
CLINICAL DATA: Recurrent urinary tract infections for the past 2
years.

EXAM:
CT ABDOMEN AND PELVIS WITHOUT CONTRAST
TECHNIQUE: Multidetector CT imaging of the abdomen and pelvis was performed
following the standard protocol without IV contrast.

[Series 1: topogram 0.60 cor · coronal · 1.00mm/px · 1 of 1 slices shown]
[im 1/1]
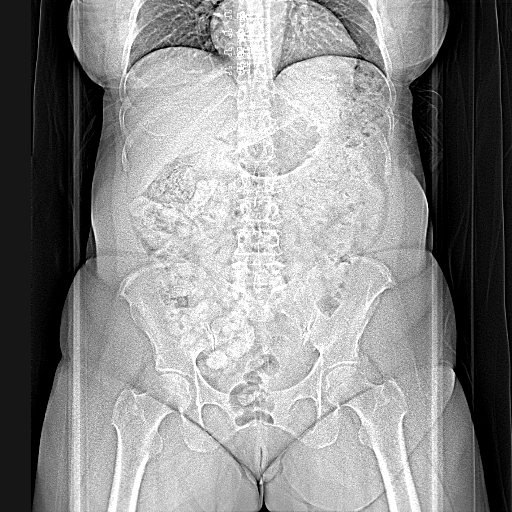

[1 of 1 positions shown; findings below may reference images not displayed]

FINDINGS: Lower chest: Small subpleural nodule is identified in the right
middle lobe measuring 4 mm, image [DATE]. No acute abnormality.

Hepatobiliary: Small low-density structure within anterior dome of
liver measures 8 mm and is too small to characterize. Gallbladder
unremarkable. No biliary ductal dilatation.

Pancreas: Unremarkable. No pancreatic ductal dilatation or
surrounding inflammatory changes.

Spleen: Normal in size without focal abnormality.

Adrenals/Urinary Tract: Normal appearance of the adrenal glands.
Right kidney unremarkable. There is asymmetric left-sided
hydronephrosis and diffuse cortical thinning. There is left-sided
hydroureter to the level of the urinary bladder where a 1.9 cm
ureterocele is suspected. No obstructing calcification identified
within the left renal collecting system and ureters. There is mild
diffuse bladder wall thickening with surrounding soft tissue fat
stranding.

Stomach/Bowel: Stomach is normal. No dilated loops of small or large
bowel. Appendix is visualized and appears normal. Normal appearance
of the colon.

Vascular/Lymphatic: The normal appearance of the abdominal aorta.
Left periaortic lymph node is enlarged measuring 1.2 cm, image [DATE].
Also in the left periaortic region is a 1.4 cm lymph node, image
34/2. No enlarged pelvic or inguinal lymph nodes.

Reproductive: Uterus and bilateral adnexa are unremarkable.

Other: No free fluid or fluid collections. Periumbilical hernia is
noted containing fat only.

Musculoskeletal: No acute or significant osseous findings.
IMPRESSION: 1. There is asymmetric left-sided hydronephrosis and hydroureter to
the level of the urinary bladder where there is a 1.9 cm ureterocele
is suspected. No obstructing stone identified. Evaluation for
underlying urothelial neoplasm is limited due to lack of IV contrast
material. Consider further evaluation with hematuria protocol CT of
the abdomen pelvis performed without and with contrast material.
2. There is mild diffuse bladder wall thickening with surrounding
soft tissue fat stranding. Correlate for any clinical signs or
symptoms of cystitis.
3. Enlarged left periaortic lymph nodes are identified measuring up
to 1.4 cm. In the absence of known malignancy these are favored to
be reactive in etiology.
4. Small subpleural nodule in the right middle lobe measures 4 mm.
No follow-up needed if patient is low-risk. Non-contrast chest CT
can be considered in 12 months if patient is high-risk. This
recommendation follows the consensus statement: Guidelines for
Management of Incidental Pulmonary Nodules Detected on CT
Images:From the [HOSPITAL] [UP]; published online before
print ( ID /[email or web address]. ID ).

ADDENDUM:
Comparison with [DATE] and [DATE]:

-The left-sided hydronephrosis and hydroureter is a new finding when
compared with [DATE].

-the enlarged left retroperitoneal lymph node measuring 1.4 cm on
the current exam measured 1.3 cm on [DATE].

-the 1.2 cm left periaortic lymph node is also unchanged when
compared with [DATE].

*** End of Addendum ***
FINDINGS: Lower chest: Small subpleural nodule is identified in the right
middle lobe measuring 4 mm, image [DATE]. No acute abnormality.

Hepatobiliary: Small low-density structure within anterior dome of
liver measures 8 mm and is too small to characterize. Gallbladder
unremarkable. No biliary ductal dilatation.

Pancreas: Unremarkable. No pancreatic ductal dilatation or
surrounding inflammatory changes.

Spleen: Normal in size without focal abnormality.

Adrenals/Urinary Tract: Normal appearance of the adrenal glands.
Right kidney unremarkable. There is asymmetric left-sided
hydronephrosis and diffuse cortical thinning. There is left-sided
hydroureter to the level of the urinary bladder where a 1.9 cm
ureterocele is suspected. No obstructing calcification identified
within the left renal collecting system and ureters. There is mild
diffuse bladder wall thickening with surrounding soft tissue fat
stranding.

Stomach/Bowel: Stomach is normal. No dilated loops of small or large
bowel. Appendix is visualized and appears normal. Normal appearance
of the colon.

Vascular/Lymphatic: The normal appearance of the abdominal aorta.
Left periaortic lymph node is enlarged measuring 1.2 cm, image [DATE].
Also in the left periaortic region is a 1.4 cm lymph node, image
34/2. No enlarged pelvic or inguinal lymph nodes.

Reproductive: Uterus and bilateral adnexa are unremarkable.

Other: No free fluid or fluid collections. Periumbilical hernia is
noted containing fat only.

Musculoskeletal: No acute or significant osseous findings.
IMPRESSION: 1. There is asymmetric left-sided hydronephrosis and hydroureter to
the level of the urinary bladder where there is a 1.9 cm ureterocele
is suspected. No obstructing stone identified. Evaluation for
underlying urothelial neoplasm is limited due to lack of IV contrast
material. Consider further evaluation with hematuria protocol CT of
the abdomen pelvis performed without and with contrast material.
2. There is mild diffuse bladder wall thickening with surrounding
soft tissue fat stranding. Correlate for any clinical signs or
symptoms of cystitis.
3. Enlarged left periaortic lymph nodes are identified measuring up
to 1.4 cm. In the absence of known malignancy these are favored to
be reactive in etiology.
4. Small subpleural nodule in the right middle lobe measures 4 mm.
No follow-up needed if patient is low-risk. Non-contrast chest CT
can be considered in 12 months if patient is high-risk. This
recommendation follows the consensus statement: Guidelines for
Management of Incidental Pulmonary Nodules Detected on CT
Images:From the [HOSPITAL] [UP]; published online before
print ( ID /[email or web address]. ID ).

## 2020-02-27 ENCOUNTER — Other Ambulatory Visit: Payer: Self-pay

## 2020-02-27 ENCOUNTER — Ambulatory Visit: Payer: BC Managed Care – PPO | Admitting: Urology

## 2020-02-27 ENCOUNTER — Encounter: Payer: Self-pay | Admitting: Urology

## 2020-02-27 VITALS — BP 145/77 | HR 78 | Ht 64.0 in | Wt 172.0 lb

## 2020-02-27 DIAGNOSIS — N1831 Chronic kidney disease, stage 3a: Secondary | ICD-10-CM

## 2020-02-27 DIAGNOSIS — N390 Urinary tract infection, site not specified: Secondary | ICD-10-CM

## 2020-02-27 DIAGNOSIS — N1339 Other hydronephrosis: Secondary | ICD-10-CM

## 2020-02-27 LAB — URINALYSIS, COMPLETE
Bilirubin, UA: NEGATIVE
Glucose, UA: NEGATIVE
Ketones, UA: NEGATIVE
Nitrite, UA: NEGATIVE
Specific Gravity, UA: 1.015 (ref 1.005–1.030)
Urobilinogen, Ur: 0.2 mg/dL (ref 0.2–1.0)
pH, UA: 5.5 (ref 5.0–7.5)

## 2020-02-27 LAB — MICROSCOPIC EXAMINATION: WBC, UA: >30 /hpf — AB (ref 0–5)

## 2020-02-27 LAB — BLADDER SCAN AMB NON-IMAGING: Scan Result: 20

## 2020-02-27 NOTE — Progress Notes (Signed)
02/27/2020 1:53 PM   Alejandra Hill [birth year] [id number]  Referring provider: Donnamarie Rossetti, PA-C [place],  [place] [address]  Chief Complaint  Patient presents with  . Recurrent UTI    HPI:  Alejandra Hill was referred for left hydronephrosis and possible ureterocele.  She is being evaluated to the cancer center for retroperitoneal lymphadenopathy. Sounds like she had a pap smear/uteriine bx. She underwent CT scan of the abdomen and pelvis 02/25/2020 which revealed left hydronephrosis of a moderate to severe nature all the way down to the bladder where there was a left hydrocele measuring 1.9 cm.  There was some mild diffuse bladder wall thickening and stranding. No gross hematuria. No flank pain. Her Cr was 1.3 in 2015, 1.7 in 2019 and now 3.0 on 01/27/2020.  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  She was seen by Dr. Alto Denver. She tried transvaginal estrogen, cranberry tablets, and bowel regimen with fiber. PVR today 20 ml. She has nocturia but limits fluids at night. No constipation. She has a good stream.    PMH: Past Medical History:  Diagnosis Date  . Allergy   . Anemia   . Overactive bladder     Surgical History: Past Surgical History:  Procedure Laterality Date  . BREAST SURGERY     fibrocystic  . LAPAROTOMY     ectopic    Home Medications:  Allergies as of 02/27/2020   No Known Allergies     Medication List       Accurate as of February 27, 2020  1:53 PM. If you have any questions, ask your nurse or doctor.        STOP taking these medications   ciprofloxacin 500 MG tablet Commonly known as: Cipro Stopped by: Festus Aloe, MD     TAKE these medications   Alive [employer] Tabs Take by mouth.   calcium citrate-vitamin D 315-200 MG-UNIT tablet Commonly known as: CITRACAL+D Take by mouth.   ferrous gluconate 240 (27 FE) MG tablet Commonly known as: FERGON Take 240 mg by mouth 2 (two) times daily.     fluconazole 150 MG tablet Commonly known as: DIFLUCAN Take 1 tablet (150 mg total) by mouth once.   GARLIC/EPA PO Take by mouth.   Iron Supplement 325 (65 FE) MG tablet Generic drug: ferrous sulfate Take 325 mg by mouth.   Multi-Vitamins Tabs Take by mouth.       Allergies: No Known Allergies  Family History: Family History  Problem Relation Age of Onset  . Cancer Mother        pancreatic  . Hypertension Mother   . Cancer Father        lung  . Hypertension Father   . Throat cancer Father   . Cancer Maternal Grandmother        bone  . Cancer Paternal Grandmother        breast    Social History:  reports that she has never smoked. She has never used smokeless tobacco. She reports current alcohol use. She reports that she does not use drugs.   Physical Exam: LMP 10/14/2011   Constitutional:  Alert and oriented, No acute distress. HEENT: [place] AT, moist mucus membranes.  Trachea midline, no masses. Cardiovascular: No clubbing, cyanosis, or edema. Respiratory: Normal respiratory effort, no increased work of breathing. GI: Abdomen is soft, nontender, nondistended, no abdominal masses Skin: No rashes, bruises or suspicious lesions. Neurologic: Grossly intact, no focal  deficits, moving all 4 extremities. Psychiatric: Normal mood and affect.  Laboratory Data: Lab Results  Component Value Date   WBC 7.2 02/02/2020   HGB 9.4 (L) 02/02/2020   HCT 31.8 (L) 02/02/2020   MCV 94.6 02/02/2020   PLT 308 02/02/2020    Lab Results  Component Value Date   CREATININE 1.31 (H) 10/13/2014    No results found for: PSA  No results found for: TESTOSTERONE  No results found for: HGBA1C  Urinalysis    Component Value Date/Time   BILIRUBINUR NEGATIVE 10/21/2014 0941   PROTEINUR 1+ 10/21/2014 0941   UROBILINOGEN negative 10/21/2014 0941   NITRITE NEGATIVE 10/21/2014 0941   LEUKOCYTESUR moderate (2+) 10/21/2014 0941    No results found for: LABMICR, WBCUA, RBCUA,  LABEPIT, MUCUS, BACTERIA  Pertinent Imaging: CT No results found for this or any previous visit. No results found for this or any previous visit. No results found for this or any previous visit. No results found for this or any previous visit. No results found for this or any previous visit. No results found for this or any previous visit. No results found for this or any previous visit. No results found for this or any previous visit.  Assessment & Plan:    1. Urinary tract infection without hematuria, site unspecified Currently. She does not appear to be clinically infected.  - Urinalysis, Complete - Bladder Scan (Post Void Residual) in office  2. Left hydro - possible left ureterocele - f/u for cysto and we will consider cysto/RGP in OR with incision or Renal scan.   3. CKD - as above. Need to consider if a ureterocele is obstructing the left and contributing to decreased GFR. No follow-ups on file.  Festus Aloe, MD  [facility] [address], [place], [place] [address] [phone]

## 2020-02-27 NOTE — Patient Instructions (Signed)

## 2020-03-01 ENCOUNTER — Telehealth: Payer: Self-pay

## 2020-03-01 LAB — CULTURE, URINE COMPREHENSIVE

## 2020-03-01 MED ORDER — CEPHALEXIN 250 MG PO CAPS: 250.0000 mg | ORAL_CAPSULE | Freq: Two times a day (BID) | ORAL | 0 refills | Status: DC

## 2020-03-01 NOTE — Telephone Encounter (Signed)
-----   Message from Festus Aloe, MD sent at 03/01/2020  4:05 PM EST ----- Judson Roch - let Ms [place] know her urine culture was mildly positive. Send in cephalexin 250 mg po BID, #10, no refills and tell her to start 03/10/2020 (the Wednesday before her cysto Friday). Thanks! ----- Message ----- From: Royanne Foots, CMA Sent: 03/01/2020   8:23 AM EST To: Festus Aloe, MD   ----- Message ----- From: Lavone Neri Lab Results In Sent: 02/27/2020   4:37 PM EST To: Rowe Robert Clinical

## 2020-03-01 NOTE — Telephone Encounter (Signed)
Patient notified and script sent 

## 2020-03-12 ENCOUNTER — Other Ambulatory Visit: Payer: Self-pay | Admitting: Radiology

## 2020-03-12 ENCOUNTER — Encounter: Payer: Self-pay | Admitting: Urology

## 2020-03-12 ENCOUNTER — Other Ambulatory Visit: Payer: Self-pay

## 2020-03-12 ENCOUNTER — Encounter: Payer: Self-pay | Admitting: Radiology

## 2020-03-12 ENCOUNTER — Ambulatory Visit (INDEPENDENT_AMBULATORY_CARE_PROVIDER_SITE_OTHER): Payer: BC Managed Care – PPO | Admitting: Urology

## 2020-03-12 VITALS — BP 155/71 | HR 66 | Ht 64.0 in | Wt 172.0 lb

## 2020-03-12 DIAGNOSIS — Z01818 Encounter for other preprocedural examination: Secondary | ICD-10-CM | POA: Diagnosis not present

## 2020-03-12 DIAGNOSIS — N2889 Other specified disorders of kidney and ureter: Secondary | ICD-10-CM

## 2020-03-12 DIAGNOSIS — N3289 Other specified disorders of bladder: Secondary | ICD-10-CM

## 2020-03-12 DIAGNOSIS — N1339 Other hydronephrosis: Secondary | ICD-10-CM | POA: Diagnosis not present

## 2020-03-12 LAB — URINALYSIS, COMPLETE
Bilirubin, UA: NEGATIVE
Glucose, UA: NEGATIVE
Ketones, UA: NEGATIVE
Nitrite, UA: NEGATIVE
Specific Gravity, UA: 1.015 (ref 1.005–1.030)
Urobilinogen, Ur: 0.2 mg/dL (ref 0.2–1.0)
pH, UA: 5.5 (ref 5.0–7.5)

## 2020-03-12 LAB — MICROSCOPIC EXAMINATION
RBC, Urine: >30 /hpf — AB (ref 0–2)
WBC, UA: >30 /hpf — AB (ref 0–5)

## 2020-03-12 MED ORDER — LIDOCAINE HCL URETHRAL/MUCOSAL 2 % EX GEL
1.0000 "application " | Freq: Once | CUTANEOUS | Status: AC
  Administered 2020-03-12: 1 via URETHRAL

## 2020-03-12 NOTE — Patient Instructions (Signed)

## 2020-03-12 NOTE — Progress Notes (Addendum)
   03/12/20  CC: No chief complaint on file.   HPI:  F/u - left hydronephrosis. Possible left ureterocele. Rising Creatinine. She was being evaluated at the cancer center for retroperitoneal lymphadenopathy. CT scan of the abdomen and pelvis 02/25/2020 which revealed left hydronephrosis of a moderate to severe nature all the way down to the bladder where there was a left ureterocele measuring 1.9 cm.  There was some mild diffuse bladder wall thickening and stranding. No gross hematuria. No flank pain. Her Cr was 1.3 in 2015, 1.7 in 2019 and now 3.0 on 01/27/2020.  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  She was seen by Dr. Alto Denver. She tried transvaginal estrogen, cranberry tablets, and bowel regimen with fiber. PVR 20 ml. She has nocturia but limits fluids at night. No constipation. She has a good stream.   Here for cystoscopy. Started cephalexin yesterday due to + urine cx for e coli.   Last menstrual period 10/14/2011. NED. A&Ox3.   No respiratory distress   Abd soft, NT, ND Normal external genitalia with patent urethral meatus Chaperone: Oki for exam and cystoscopy   Cystoscopy Procedure Note  Patient identification was confirmed, informed consent was obtained, and patient was prepped using Betadine solution.  Lidocaine jelly was administered per urethral meatus.    Procedure: - Flexible cystoscope introduced, without any difficulty.   - Thorough search of the bladder revealed:    normal urethral meatus    Urothelium red and thickened left side and anterior     no stones    no ulcers     no tumors    no urethral polyps    no trabeculation  - Right ureteral orifices was in normal in position and appearance. -Left ureteral orifice appeared to be involved with a left ureterocele   Post-Procedure: - Patient tolerated the procedure well  Assessment/ Plan:  Left hydronephrosis.  Left ureterocele.  Rising Creatinine. Bladder wall thickening and  stranding -   I discussed with the patient the nature, potential benefits, risks and alternatives to cystoscopy, bladder biopsy, fulguration, left retrograde pyelogram, incision of left ureterocele with laser or incision and placement of ureteral stent, possible left ureteroscopy, including side effects of the proposed treatment, the likelihood of the patient achieving the goals of the procedure, and any potential problems that might occur during the procedure or recuperation. All questions answered. Patient elects to proceed. Discussed a colleague would be doing the procedure.      No follow-ups on file.  Festus Aloe, MD

## 2020-03-15 ENCOUNTER — Telehealth: Payer: Self-pay

## 2020-03-15 DIAGNOSIS — R591 Generalized enlarged lymph nodes: Secondary | ICD-10-CM

## 2020-03-15 LAB — CULTURE, URINE COMPREHENSIVE

## 2020-03-15 NOTE — Telephone Encounter (Signed)
Done... Lab/MD in 1 year appt has been scheduled as requested Pt is aware

## 2020-03-15 NOTE — Telephone Encounter (Signed)
-----   Message from Earlie Server, MD sent at 03/13/2020 11:47 PM EDT ----- Please arrange patient to follow up with me in 1 year. Lab md - please order cbc and cmp. Thank you  Dear Ms.[place],  You CT scan showed similar level of lymph node enlargement.  Left side kidney swelling, please continue to follow up with urologist Dr.Eskridge.  We will contact you to make 1 year follow up appointment.  Take care  Dr.Yu

## 2020-03-16 ENCOUNTER — Telehealth: Payer: Self-pay

## 2020-03-16 DIAGNOSIS — B9620 Unspecified Escherichia coli [E. coli] as the cause of diseases classified elsewhere: Secondary | ICD-10-CM

## 2020-03-16 DIAGNOSIS — N390 Urinary tract infection, site not specified: Secondary | ICD-10-CM

## 2020-03-16 MED ORDER — SULFAMETHOXAZOLE-TRIMETHOPRIM 800-160 MG PO TABS: 1.0000 | ORAL_TABLET | Freq: Two times a day (BID) | ORAL | 0 refills | Status: AC

## 2020-03-16 NOTE — Telephone Encounter (Signed)
-----   Message from Billey Co, MD sent at 03/16/2020  8:13 AM EDT ----- Please confirm she is on abx for UTI, and abx continue through Friday when she is having surgery.  If not on abx, please start Bactrim DS BID x 5 days, thanks  Nickolas Madrid, MD 03/16/2020

## 2020-03-16 NOTE — Telephone Encounter (Signed)
Called pt informed her of the information below. RX sent in. Pt voiced understanding.  ?

## 2020-03-17 ENCOUNTER — Other Ambulatory Visit: Payer: Self-pay

## 2020-03-17 ENCOUNTER — Encounter
Admission: RE | Admit: 2020-03-17 | Discharge: 2020-03-17 | Disposition: A | Discharge status: Home / Self Care | Payer: BC Managed Care – PPO | Source: Ambulatory Visit | Attending: Urology | Admitting: Urology

## 2020-03-17 DIAGNOSIS — Z20822 Contact with and (suspected) exposure to covid-19: Secondary | ICD-10-CM | POA: Insufficient documentation

## 2020-03-17 DIAGNOSIS — Z01812 Encounter for preprocedural laboratory examination: Secondary | ICD-10-CM | POA: Insufficient documentation

## 2020-03-17 HISTORY — DX: Gastro-esophageal reflux disease without esophagitis: K21.9

## 2020-03-17 LAB — SARS CORONAVIRUS 2 (TAT 6-24 HRS): SARS Coronavirus 2: NEGATIVE

## 2020-03-17 NOTE — Patient Instructions (Signed)
Your procedure is scheduled on: Friday 03/19/20.  Report to DAY SURGERY DEPARTMENT LOCATED ON 2ND FLOOR MEDICAL MALL ENTRANCE. To find out your arrival time please call [phone] between 1PM - 3PM on Thursday 03/18/20.   Remember: Instructions that are not followed completely may result in serious medical risk, up to and including death, or upon the discretion of your surgeon and anesthesiologist your surgery may need to be rescheduled.      _X__ 1. Do not eat food after midnight the night before your procedure.                 No gum chewing or hard candies. You may drink clear liquids up to 2 hours                 before you are scheduled to arrive for your surgery- DO NOT drink clear                 liquids within 2 hours of the start of your surgery.                 Clear Liquids include:  water, apple juice without pulp, clear carbohydrate                 drink such as Clearfast or Gatorade, Black Coffee or Tea (Do not add                 anything to coffee or tea).   __X__2.  On the morning of surgery brush your teeth with toothpaste and water, you may rinse your mouth with mouthwash if you wish.  Do not swallow any toothpaste or mouthwash.       __X__ 3.  No Alcohol for 24 hours before or after surgery.     __X__4.  Notify your doctor if there is any change in your medical condition      (cold, fever, infections).      Do not wear jewelry, make-up, hairpins, clips or nail polish. Do not wear lotions, powders, or perfumes.  Do not shave 48 hours prior to surgery. Men may shave face and neck. Do not bring valuables to the hospital.     [facility] is not responsible for any belongings or valuables.   Contacts, dentures/partials or body piercings may not be worn into surgery. Bring a case for your contacts, glasses or hearing aids, a denture cup will be supplied.   Patients discharged the day of surgery will not be allowed to drive home.     __X__ Take these  medicines the morning of surgery with A SIP OF WATER:     1. omeprazole (PRILOSEC)  2. sulfamethoxazole-trimethoprim (BACTRIM DS)  3. fexofenadine (ALLEGRA) if needed      __X__ Stop Anti-inflammatories 7 days before surgery such as Advil, Ibuprofen, Motrin, BC or Goodies Powder, Naprosyn, Naproxen, Aleve, Aspirin, Meloxicam. May take Tylenol if needed for pain or discomfort.     __X__ Don't start any new herbal supplements or vitamins before your surgery.Marland Kitchen

## 2020-03-18 ENCOUNTER — Other Ambulatory Visit: Payer: BC Managed Care – PPO

## 2020-03-19 ENCOUNTER — Encounter
Admission: RE | Disposition: A | Discharge status: Home / Self Care | Payer: Self-pay | Source: Home / Self Care | Attending: Urology

## 2020-03-19 ENCOUNTER — Ambulatory Visit: Payer: BC Managed Care – PPO | Admitting: Certified Registered Nurse Anesthetist

## 2020-03-19 ENCOUNTER — Other Ambulatory Visit: Payer: Self-pay

## 2020-03-19 ENCOUNTER — Encounter: Payer: Self-pay | Admitting: Urology

## 2020-03-19 ENCOUNTER — Ambulatory Visit: Payer: BC Managed Care – PPO

## 2020-03-19 ENCOUNTER — Ambulatory Visit
Admission: RE | Admit: 2020-03-19 | Discharge: 2020-03-19 | Disposition: A | Discharge status: Home / Self Care | Payer: BC Managed Care – PPO | Attending: Urology | Admitting: Urology

## 2020-03-19 DIAGNOSIS — K219 Gastro-esophageal reflux disease without esophagitis: Secondary | ICD-10-CM | POA: Diagnosis not present

## 2020-03-19 DIAGNOSIS — N3289 Other specified disorders of bladder: Secondary | ICD-10-CM

## 2020-03-19 DIAGNOSIS — N3021 Other chronic cystitis with hematuria: Secondary | ICD-10-CM | POA: Diagnosis not present

## 2020-03-19 DIAGNOSIS — N1330 Unspecified hydronephrosis: Secondary | ICD-10-CM | POA: Insufficient documentation

## 2020-03-19 DIAGNOSIS — N189 Chronic kidney disease, unspecified: Secondary | ICD-10-CM | POA: Diagnosis not present

## 2020-03-19 DIAGNOSIS — D303 Benign neoplasm of bladder: Secondary | ICD-10-CM | POA: Diagnosis not present

## 2020-03-19 DIAGNOSIS — D649 Anemia, unspecified: Secondary | ICD-10-CM | POA: Insufficient documentation

## 2020-03-19 DIAGNOSIS — N2889 Other specified disorders of kidney and ureter: Secondary | ICD-10-CM | POA: Diagnosis present

## 2020-03-19 DIAGNOSIS — Q6231 Congenital ureterocele, orthotopic: Secondary | ICD-10-CM | POA: Diagnosis not present

## 2020-03-19 DIAGNOSIS — Z87440 Personal history of urinary (tract) infections: Secondary | ICD-10-CM | POA: Insufficient documentation

## 2020-03-19 DIAGNOSIS — N1339 Other hydronephrosis: Secondary | ICD-10-CM

## 2020-03-19 HISTORY — PX: CYSTOSCOPY WITH URETEROSCOPY: SHX5123

## 2020-03-19 HISTORY — PX: CYSTOSCOPY W/ RETROGRADES: SHX1426

## 2020-03-19 HISTORY — PX: CYSTOSCOPY WITH BIOPSY: SHX5122

## 2020-03-19 SURGERY — CYSTOSCOPY, WITH BIOPSY
Anesthesia: General

## 2020-03-19 MED ORDER — ONDANSETRON HCL 4 MG/2ML IJ SOLN
INTRAMUSCULAR | Status: DC | PRN
  Administered 2020-03-19: 4 mg via INTRAVENOUS

## 2020-03-19 MED ORDER — FENTANYL CITRATE (PF) 100 MCG/2ML IJ SOLN
INTRAMUSCULAR | Status: DC | PRN
  Administered 2020-03-19 (×2): 50 ug via INTRAVENOUS

## 2020-03-19 MED ORDER — PROPOFOL 10 MG/ML IV BOLUS
INTRAVENOUS | Status: DC | PRN
  Administered 2020-03-19: 150 mg via INTRAVENOUS

## 2020-03-19 MED ORDER — HYDROCODONE-ACETAMINOPHEN 5-325 MG PO TABS: 1.0000 | ORAL_TABLET | Freq: Once | ORAL | Status: AC

## 2020-03-19 MED ORDER — MIDAZOLAM HCL 2 MG/2ML IJ SOLN
INTRAMUSCULAR | Status: DC | PRN
  Administered 2020-03-19: 2 mg via INTRAVENOUS

## 2020-03-19 MED ORDER — HYDROCODONE-ACETAMINOPHEN 5-325 MG PO TABS
ORAL_TABLET | ORAL | Status: AC
  Administered 2020-03-19: 15:00:00 1 via ORAL
  Filled 2020-03-19: qty 1

## 2020-03-19 MED ORDER — PROPOFOL 10 MG/ML IV BOLUS
INTRAVENOUS | Status: AC
  Filled 2020-03-19: qty 20

## 2020-03-19 MED ORDER — HYDROCODONE-ACETAMINOPHEN 5-325 MG PO TABS: 1.0000 | ORAL_TABLET | ORAL | 0 refills | Status: AC | PRN

## 2020-03-19 MED ORDER — MIDAZOLAM HCL 2 MG/2ML IJ SOLN
INTRAMUSCULAR | Status: AC
  Filled 2020-03-19: qty 2

## 2020-03-19 MED ORDER — FENTANYL CITRATE (PF) 100 MCG/2ML IJ SOLN: 25.0000 ug | INTRAMUSCULAR | Status: DC | PRN

## 2020-03-19 MED ORDER — ROCURONIUM BROMIDE 10 MG/ML (PF) SYRINGE
PREFILLED_SYRINGE | INTRAVENOUS | Status: AC
  Filled 2020-03-19: qty 10

## 2020-03-19 MED ORDER — ONDANSETRON HCL 4 MG/2ML IJ SOLN
INTRAMUSCULAR | Status: AC
  Filled 2020-03-19: qty 2

## 2020-03-19 MED ORDER — DEXAMETHASONE SODIUM PHOSPHATE 10 MG/ML IJ SOLN
INTRAMUSCULAR | Status: DC | PRN
  Administered 2020-03-19: 10 mg via INTRAVENOUS

## 2020-03-19 MED ORDER — LIDOCAINE HCL (PF) 2 % IJ SOLN
INTRAMUSCULAR | Status: AC
  Filled 2020-03-19: qty 10

## 2020-03-19 MED ORDER — DEXAMETHASONE SODIUM PHOSPHATE 10 MG/ML IJ SOLN
INTRAMUSCULAR | Status: AC
  Filled 2020-03-19: qty 1

## 2020-03-19 MED ORDER — SUCCINYLCHOLINE CHLORIDE 20 MG/ML IJ SOLN
INTRAMUSCULAR | Status: DC | PRN
  Administered 2020-03-19: 100 mg via INTRAVENOUS

## 2020-03-19 MED ORDER — GLYCOPYRROLATE 0.2 MG/ML IJ SOLN
INTRAMUSCULAR | Status: DC | PRN
  Administered 2020-03-19: .2 mg via INTRAVENOUS

## 2020-03-19 MED ORDER — FENTANYL CITRATE (PF) 100 MCG/2ML IJ SOLN
INTRAMUSCULAR | Status: AC
  Filled 2020-03-19: qty 2

## 2020-03-19 MED ORDER — LACTATED RINGERS IV SOLN: INTRAVENOUS | Status: DC

## 2020-03-19 MED ORDER — CEFAZOLIN SODIUM-DEXTROSE 2-4 GM/100ML-% IV SOLN
INTRAVENOUS | Status: AC
  Filled 2020-03-19: qty 100

## 2020-03-19 MED ORDER — SUGAMMADEX SODIUM 200 MG/2ML IV SOLN
INTRAVENOUS | Status: DC | PRN
  Administered 2020-03-19: 180 mg via INTRAVENOUS

## 2020-03-19 MED ORDER — ROCURONIUM BROMIDE 100 MG/10ML IV SOLN
INTRAVENOUS | Status: DC | PRN
  Administered 2020-03-19: 25 mg via INTRAVENOUS
  Administered 2020-03-19 (×2): 5 mg via INTRAVENOUS

## 2020-03-19 MED ORDER — SUCCINYLCHOLINE CHLORIDE 200 MG/10ML IV SOSY
PREFILLED_SYRINGE | INTRAVENOUS | Status: AC
  Filled 2020-03-19: qty 10

## 2020-03-19 MED ORDER — CEFAZOLIN SODIUM-DEXTROSE 2-4 GM/100ML-% IV SOLN
2.0000 g | INTRAVENOUS | Status: AC
  Administered 2020-03-19: 2 g via INTRAVENOUS

## 2020-03-19 MED ORDER — PHENYLEPHRINE HCL (PRESSORS) 10 MG/ML IV SOLN
INTRAVENOUS | Status: DC | PRN
  Administered 2020-03-19 (×4): 100 ug via INTRAVENOUS

## 2020-03-19 MED ORDER — LIDOCAINE HCL (CARDIAC) PF 100 MG/5ML IV SOSY
PREFILLED_SYRINGE | INTRAVENOUS | Status: DC | PRN
  Administered 2020-03-19: 100 mg via INTRAVENOUS

## 2020-03-19 MED ORDER — ONDANSETRON HCL 4 MG/2ML IJ SOLN: 4.0000 mg | Freq: Once | INTRAMUSCULAR | Status: DC | PRN

## 2020-03-19 SURGICAL SUPPLY — 28 items
BAG DRAIN CYSTO-URO LG1000N (MISCELLANEOUS) ×4 IMPLANT
BRUSH SCRUB EZ  4% CHG (MISCELLANEOUS) ×2
BRUSH SCRUB EZ 4% CHG (MISCELLANEOUS) ×2 IMPLANT
CATH URETL 5X70 OPEN END (CATHETERS) ×4 IMPLANT
CONRAY 43 FOR UROLOGY 50M (MISCELLANEOUS) ×4 IMPLANT
DRAPE UTILITY 15X26 TOWEL STRL (DRAPES) ×4 IMPLANT
DRSG TELFA 4X3 1S NADH ST (GAUZE/BANDAGES/DRESSINGS) ×4 IMPLANT
ELECT REM PT RETURN 9FT ADLT (ELECTROSURGICAL) ×4
ELECTRODE REM PT RTRN 9FT ADLT (ELECTROSURGICAL) ×2 IMPLANT
GLOVE BIOGEL PI IND STRL 7.5 (GLOVE) ×4 IMPLANT
GLOVE BIOGEL PI INDICATOR 7.5 (GLOVE) ×4
GLOVE PROTEXIS LATEX SZ 7.5 (GLOVE) ×12 IMPLANT
GOWN STRL REUS W/ TWL LRG LVL3 (GOWN DISPOSABLE) ×4 IMPLANT
GOWN STRL REUS W/ TWL XL LVL3 (GOWN DISPOSABLE) ×2 IMPLANT
GOWN STRL REUS W/TWL LRG LVL3 (GOWN DISPOSABLE) ×4
GOWN STRL REUS W/TWL XL LVL3 (GOWN DISPOSABLE) ×2
GUIDEWIRE STR DUAL SENSOR (WIRE) ×4 IMPLANT
KIT TURNOVER CYSTO (KITS) ×4 IMPLANT
PACK CYSTO AR (MISCELLANEOUS) ×4 IMPLANT
SET CYSTO W/LG BORE CLAMP LF (SET/KITS/TRAYS/PACK) ×4 IMPLANT
SOL .9 NS 3000ML IRR  AL (IV SOLUTION) ×2
SOL .9 NS 3000ML IRR UROMATIC (IV SOLUTION) ×2 IMPLANT
STENT URET 6FRX24 CONTOUR (STENTS) IMPLANT
STENT URET 6FRX26 CONTOUR (STENTS) IMPLANT
SURGILUBE 2OZ TUBE FLIPTOP (MISCELLANEOUS) ×4 IMPLANT
SYRINGE IRR TOOMEY STRL 70CC (SYRINGE) IMPLANT
WATER STERILE IRR 1000ML POUR (IV SOLUTION) ×4 IMPLANT
WATER STERILE IRR 3000ML UROMA (IV SOLUTION) ×4 IMPLANT

## 2020-03-19 NOTE — Anesthesia Procedure Notes (Signed)
Procedure Name: Intubation Performed by: Jimmylee Ratterree, CRNA Pre-anesthesia Checklist: Patient identified, Patient being monitored, Timeout performed, Emergency Drugs available and Suction available Patient Re-evaluated:Patient Re-evaluated prior to induction Oxygen Delivery Method: Circle system utilized Preoxygenation: Pre-oxygenation with 100% oxygen Induction Type: IV induction Ventilation: Mask ventilation without difficulty Laryngoscope Size: Mac and 3 Grade View: Grade II Tube type: Oral Tube size: 7.0 mm Number of attempts: 1 Airway Equipment and Method: Stylet Placement Confirmation: ETT inserted through vocal cords under direct vision,  positive ETCO2 and breath sounds checked- equal and bilateral Secured at: 21 cm Tube secured with: Tape Dental Injury: Teeth and Oropharynx as per pre-operative assessment        

## 2020-03-19 NOTE — Op Note (Signed)
Date of procedure: 03/19/20  Preoperative diagnosis:  1. Bladder lesion 2. Left ureterocele  Postoperative diagnosis:  1. Same  Procedure: 1. Cystoscopy, left retrograde pyelogram with intraoperative interpretation 2. Bladder biopsy and fulguration 3. Endoscopic incision of left ureterocele 4. Left diagnostic ureteroscopy  Surgeon: Nickolas Madrid, MD  Anesthesia: General  Complications: None  Intraoperative findings:  1.  Bullous erythema throughout the bladder in multiple locations, more consistent with cystitis then malignancy.  Biopsied and fulgurated. 2.  Left ureterocele incised with Collins knife, left diagnostic ureteroscopy showed widely patent ureter, no evidence of malignancy, and easily able to pass the scope into the mid ureter 3.  Left retrograde pyelogram showing moderate to severe upstream hydroureteronephrosis  EBL: Minimal  Specimens: Bladder biopsy  Drains: None  Indication: Alejandra Hill is a 59 y.o. patient with recurrent UTIs who was found to have a left ureterocele on CT, as well as some bladder erythema on cystoscopy.  After reviewing the management options for treatment, they elected to proceed with the above surgical procedure(s). We have discussed the potential benefits and risks of the procedure, side effects of the proposed treatment, the likelihood of the patient achieving the goals of the procedure, and any potential problems that might occur during the procedure or recuperation. Informed consent has been obtained.  Description of procedure:  The patient was taken to the operating room and general anesthesia was induced. SCDs were placed for DVT prophylaxis.The patient was placed in the dorsal lithotomy position, prepped and draped in the usual sterile fashion, and preoperative antibiotics(cefazolin) were administered. A preoperative time-out was performed.   A 21 French rigid cystoscope was used to intubate the urethra and thorough cystoscopy was  performed.  There was clearly a large 2 cm left ureterocele protruding into the bladder at the area of the left ureteral orifice.  There was a pinpoint orifice with minimal efflux of urine.  The ureter was orthotopic on the right side.  There was some bullous erythema scattered throughout the bladder in multiple locations that was most consistent with cystitis as opposed to malignancy.  The cold cup biopsy forcep was used to take biopsies of one of the erythematous areas.  The resectoscope with the [facility] was used to achieve hemostasis at the biopsy location.  There was no evidence of bladder perforation.  The resectoscope with the [facility] knife was then used to make a 1 cm incision over the ureterocele.  To confirm adequate opening of the ureterocele, I advanced a semirigid ureteroscope into the opening and it advanced easily up into the distal and mid ureter and appeared widely patent.  Contrast was injected through the scope for retrograde pyelogram and showed moderate to severe upstream hydroureteronephrosis.  There is no evidence of malignancy.  As the ureter was widely patent after incision, I elected not to leave a stent.  There was brisk drainage of fluid from the left ureter under direct vision.  There was excellent hemostasis with the bladder decompressed.  The bladder was drained and this concluded the procedure.  Disposition: Stable to PACU  Plan: We will call with pathology Follow-up in 8 weeks with renal ultrasound prior  Nickolas Madrid, MD

## 2020-03-19 NOTE — OR Nursing (Signed)
Per Dr. Diamantina Providence, secure chat, he will call pt w/path results; also, he will notify office he needs to see pt for follow up in 8 weeks.  Added to discharge instruction sheet.

## 2020-03-19 NOTE — OR Nursing (Signed)
Upon questioning pt. For pre-op she advised she consumed applesauce at approximately 07:30. I then called Dr. Ronelle Nigh and reported same. He advised the surgery would have to be delayed 6 hours and that he would contact Dr. Diamantina Providence and advise him. Pt. Was advised of the delay and she verbalized an understanding and began watching TV. Pt. Was cal and in NAD.

## 2020-03-19 NOTE — Anesthesia Preprocedure Evaluation (Signed)
Anesthesia Evaluation  Patient identified by MRN, date of birth, ID band Patient awake    Reviewed: Allergy & Precautions, H&P , NPO status , Patient's Chart, lab work & pertinent test results, reviewed documented beta blocker date and time   Airway Mallampati: II  TM Distance: >3 FB Neck ROM: full    Dental  (+) Teeth Intact   Pulmonary neg pulmonary ROS,    Pulmonary exam normal        Cardiovascular Exercise Tolerance: Good negative cardio ROS Normal cardiovascular exam Rate:Normal     Neuro/Psych negative neurological ROS  negative psych ROS   GI/Hepatic negative GI ROS, Neg liver ROS, GERD  Medicated,  Endo/Other  negative endocrine ROS  Renal/GU Renal diseasenegative Renal ROS  negative genitourinary   Musculoskeletal   Abdominal   Peds  Hematology negative hematology ROS (+) Blood dyscrasia, anemia ,   Anesthesia Other Findings   Reproductive/Obstetrics negative OB ROS                             Anesthesia Physical Anesthesia Plan  ASA: III  Anesthesia Plan: General LMA   Post-op Pain Management:    Induction:   PONV Risk Score and Plan:   Airway Management Planned:   Additional Equipment:   Intra-op Plan:   Post-operative Plan:   Informed Consent: I have reviewed the patients History and Physical, chart, labs and discussed the procedure including the risks, benefits and alternatives for the proposed anesthesia with the patient or authorized representative who has indicated his/her understanding and acceptance.       Plan Discussed with: CRNA  Anesthesia Plan Comments:         Anesthesia Quick Evaluation

## 2020-03-19 NOTE — Transfer of Care (Signed)
Immediate Anesthesia Transfer of Care Note  Patient: Alejandra Hill [place]  Procedure(s) Performed: CYSTOSCOPY WITH bladder BIOPSY and fulgeration (N/A ) CYSTOSCOPY WITH RETROGRADE PYELOGRAM (Left ) CYSTOSCOPY WITH URETEROSCOPY (Left )  Patient Location: PACU  Anesthesia Type:General  Level of Consciousness: sedated  Airway & Oxygen Therapy: Patient Spontanous Breathing and Patient connected to face mask oxygen  Post-op Assessment: Report given to RN and Post -op Vital signs reviewed and stable  Post vital signs: Reviewed and stable  Last Vitals:  Vitals Value Taken Time  BP 131/70 03/19/20 1436  Temp    Pulse 73 03/19/20 1436  Resp 12 03/19/20 1436  SpO2 100 % 03/19/20 1436    Last Pain:  Vitals:   03/19/20 0823  TempSrc: Tympanic         Complications: No apparent anesthesia complications

## 2020-03-19 NOTE — H&P (Signed)
UROLOGY H&P UPDATE  Agree with prior H&P dated 02/27/20 by Dr. Junious Silk.  Left-sided moderate to severe hydronephrosis secondary to a left ureterocele as well as some bladder thickening and edema.  Here today for incision of ureterocele, possible stent placement, biopsy and fulguration.  Cardiac: RRR Lungs: CTA bilaterally  Laterality: Left Procedure: Cystoscopy, bladder biopsy and fulguration, incision of left-sided ureterocele, possible stent placement  Urine: Culture 3/19 with 25-50 K E. coli, has been treated with culture appropriate antibiotics  We specifically discussed the risks ureteroscopy including bleeding, infection/sepsis, stent related symptoms including flank pain/urgency/frequency/incontinence/dysuria, ureteral injury, recurrence, or need for staged or additional procedures pending pathology results.   Billey Co, MD 03/19/2020

## 2020-03-19 NOTE — Discharge Instructions (Signed)

## 2020-03-22 ENCOUNTER — Telehealth: Payer: Self-pay | Admitting: Urology

## 2020-03-22 NOTE — Anesthesia Postprocedure Evaluation (Signed)
Anesthesia Post Note  Patient: Alejandra Hill  Procedure(s) Performed: CYSTOSCOPY WITH bladder BIOPSY and fulgeration (N/A ) CYSTOSCOPY WITH RETROGRADE PYELOGRAM (Left ) CYSTOSCOPY WITH URETEROSCOPY (Left )  Patient location during evaluation: PACU Anesthesia Type: General Level of consciousness: awake and alert Pain management: pain level controlled Vital Signs Assessment: post-procedure vital signs reviewed and stable Respiratory status: spontaneous breathing, nonlabored ventilation, respiratory function stable and patient connected to nasal cannula oxygen Cardiovascular status: blood pressure returned to baseline and stable Postop Assessment: no apparent nausea or vomiting Anesthetic complications: no     Last Vitals:  Vitals:   03/19/20 1522 03/19/20 1530  BP: 139/72 113/86  Pulse:  69  Resp: 11 16  Temp: (!) 36.1 C (!) 36.3 C  SpO2: 100% 100%    Last Pain:  Vitals:   03/22/20 0842  TempSrc:   PainSc: 0-No pain                 Molli Barrows

## 2020-03-22 NOTE — Telephone Encounter (Signed)
-----   Message from Billey Co, MD sent at 03/19/2020  2:31 PM EDT ----- Regarding: follow up Please schedule follow up with me in 8 weeks with renal US prior, thanks. I will call her with pathology results next week.  Nickolas Madrid, MD 03/19/2020

## 2020-03-22 NOTE — Telephone Encounter (Signed)
done

## 2020-03-24 LAB — SURGICAL PATHOLOGY

## 2020-03-29 NOTE — Telephone Encounter (Addendum)
Patient informed, voiced understanding.   ----- Message from Billey Co, MD sent at 03/26/2020  3:53 PM EDT ----- No cancer seen on bladder biopsy, just inflammation. Follow up as scheduled in May  Brian Sninsky, MD 03/26/2020

## 2020-05-03 DIAGNOSIS — R809 Proteinuria, unspecified: Secondary | ICD-10-CM | POA: Insufficient documentation

## 2020-05-03 DIAGNOSIS — N179 Acute kidney failure, unspecified: Secondary | ICD-10-CM | POA: Insufficient documentation

## 2020-05-03 DIAGNOSIS — R7989 Other specified abnormal findings of blood chemistry: Secondary | ICD-10-CM | POA: Insufficient documentation

## 2020-05-08 ENCOUNTER — Ambulatory Visit: Payer: BC Managed Care – PPO | Attending: Internal Medicine

## 2020-05-08 DIAGNOSIS — Z23 Encounter for immunization: Secondary | ICD-10-CM

## 2020-05-08 NOTE — Progress Notes (Signed)
   Covid-19 Vaccination Clinic  Name:  Alejandra Hill    MRN: [id number] DOB: 1961/04/05  05/08/2020  [name] was observed post Covid-19 immunization for 15 minutes without incident. She was provided with Vaccine Information Sheet and instruction to access the V-Safe system.   [name] was instructed to call 911 with any severe reactions post vaccine: Marland Kitchen Difficulty breathing  . Swelling of face and throat  . A fast heartbeat  . A bad rash all over body  . Dizziness and weakness   Immunizations Administered    Name Date Dose VIS Date Route   Pfizer COVID-19 Vaccine 05/08/2020  8:21 AM 0.3 mL 02/18/2019 Intramuscular   Manufacturer: [place]   Lot: [id number]   [place]: [id number]

## 2020-05-19 ENCOUNTER — Encounter: Payer: Self-pay | Admitting: Urology

## 2020-05-19 ENCOUNTER — Other Ambulatory Visit: Payer: Self-pay

## 2020-05-19 ENCOUNTER — Ambulatory Visit: Payer: BC Managed Care – PPO | Admitting: Urology

## 2020-05-19 VITALS — BP 133/70 | HR 66 | Ht 64.0 in | Wt 170.0 lb

## 2020-05-19 DIAGNOSIS — N390 Urinary tract infection, site not specified: Secondary | ICD-10-CM | POA: Diagnosis not present

## 2020-05-19 DIAGNOSIS — N184 Chronic kidney disease, stage 4 (severe): Secondary | ICD-10-CM | POA: Diagnosis not present

## 2020-05-19 DIAGNOSIS — N2889 Other specified disorders of kidney and ureter: Secondary | ICD-10-CM

## 2020-05-19 NOTE — Progress Notes (Signed)
   05/19/2020 8:59 AM   Holley Raring G [place] 28-Sep-1961 [id number]  Reason for visit: Follow up left ureterocele  HPI: I saw Ms. [place] in urology clinic today for follow-up after undergoing incision of a left ureterocele.  She is a 59 year old female with CKD and recurrent UTIs who was originally evaluated by Dr. Junious Silk.  She was found to have moderate to severe left hydroureteronephrosis with a left ureterocele on CT on 03/10/2020.  On 03/20/2019 when she underwent cystoscopy, bladder biopsy, and incision of left ureterocele.  Pathology of the bladder just showed inflammation and no evidence of malignancy.  She denies any left-sided flank pain since surgery.  She has not had any UTIs.  Renal function is improved with most recent creatinine of 2.59(EGFR 22) on 05/03/2020 from sCr of 3.0 prior to surgery.  She has not yet had a renal ultrasound performed to confirm resolution of prior hydronephrosis.  I ordered the ultrasound today.  We reviewed the need for close follow-up to confirm resolution of prior hydronephrosis and adequate drainage of the kidney.  If renal ultrasound shows persistent severe hydronephrosis, would consider a nuc med renal scan to evaluate function and drainage.  Continue to follow with nephrology regarding CKD.  We discussed the importance of adequate hydration and avoiding nephrotoxic agents.  We also discussed UTI prevention strategies including cranberry tablets, adequate hydration, and timed voiding.  We will call with renal ultrasound results Continue to follow with nephrology RTC 6 months   Billey Co, MD  [facility] [address] [place], [place] [address] [phone]

## 2020-05-19 NOTE — Patient Instructions (Signed)
Will call with ultrasound results, Radiology will call you to schedule

## 2020-06-01 ENCOUNTER — Ambulatory Visit: Payer: BC Managed Care – PPO | Attending: Internal Medicine

## 2020-06-01 DIAGNOSIS — Z23 Encounter for immunization: Secondary | ICD-10-CM

## 2020-06-01 NOTE — Progress Notes (Signed)
   Covid-19 Vaccination Clinic  Name:  Alejandra Hill    MRN: [id number] DOB: November 03, 1961  06/01/2020  [name] was observed post Covid-19 immunization for 15 minutes without incident. She was provided with Vaccine Information Sheet and instruction to access the V-Safe system.   [name] was instructed to call 911 with any severe reactions post vaccine: Marland Kitchen Difficulty breathing  . Swelling of face and throat  . A fast heartbeat  . A bad rash all over body  . Dizziness and weakness   Immunizations Administered    Name Date Dose VIS Date Route   Pfizer COVID-19 Vaccine 06/01/2020  8:47 AM 0.3 mL 02/18/2019 Intramuscular   Manufacturer: [place]   Lot: [id number]   Mora: [id number]

## 2020-06-11 ENCOUNTER — Other Ambulatory Visit: Payer: Self-pay

## 2020-06-11 ENCOUNTER — Ambulatory Visit
Admission: RE | Admit: 2020-06-11 | Discharge: 2020-06-11 | Disposition: A | Discharge status: Home / Self Care | Payer: BC Managed Care – PPO | Source: Ambulatory Visit | Attending: Urology | Admitting: Urology

## 2020-06-11 DIAGNOSIS — N2889 Other specified disorders of kidney and ureter: Secondary | ICD-10-CM | POA: Diagnosis not present

## 2020-06-11 IMAGING — US US RENAL
1 series · 13 of 25 positions shown · non-contrast
Comparison: Prior CT from [DATE].

CLINICAL DATA: Follow-up examination to evaluate for residual
left-sided hydronephrosis status post ureterocele incision.

EXAM:
RENAL / URINARY TRACT ULTRASOUND COMPLETE

[Series 1: us renal · 13 of 39 slices shown]
[im 1/39]
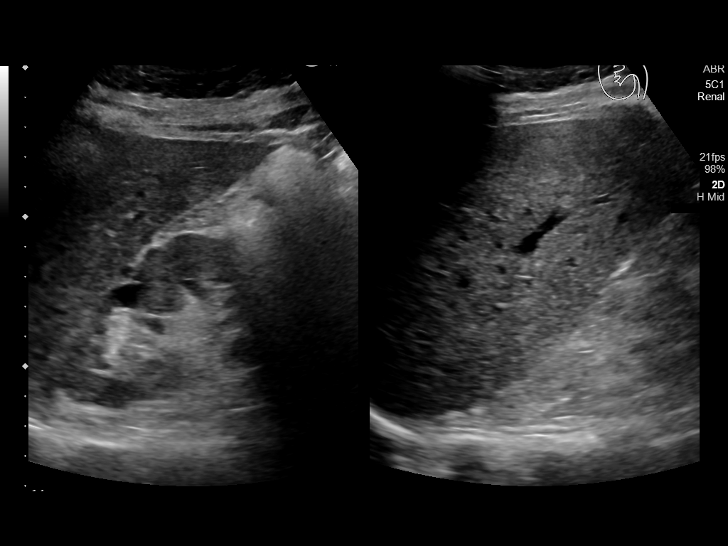
[im 4/39]
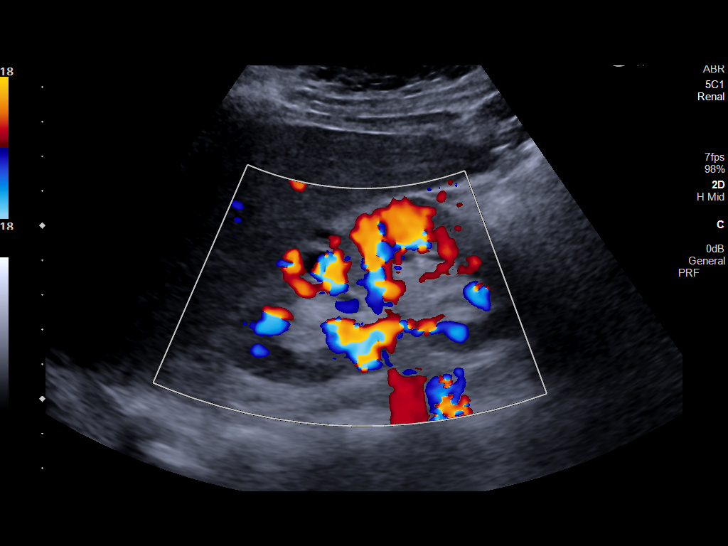
[im 7/39]
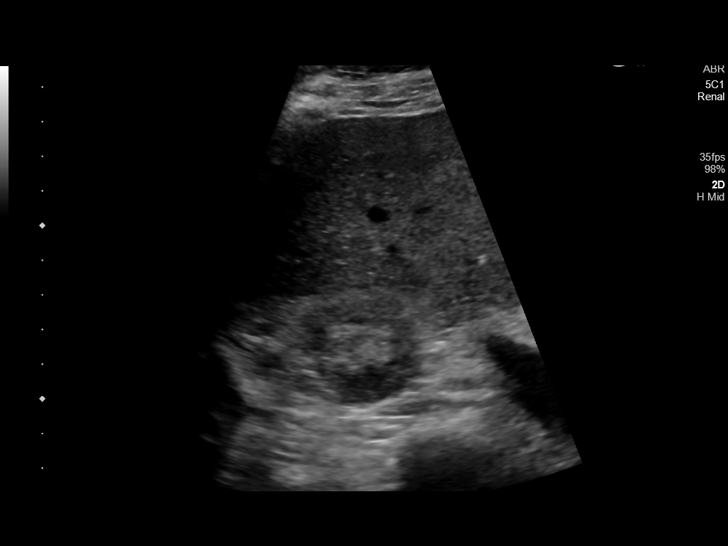
[im 10/39]
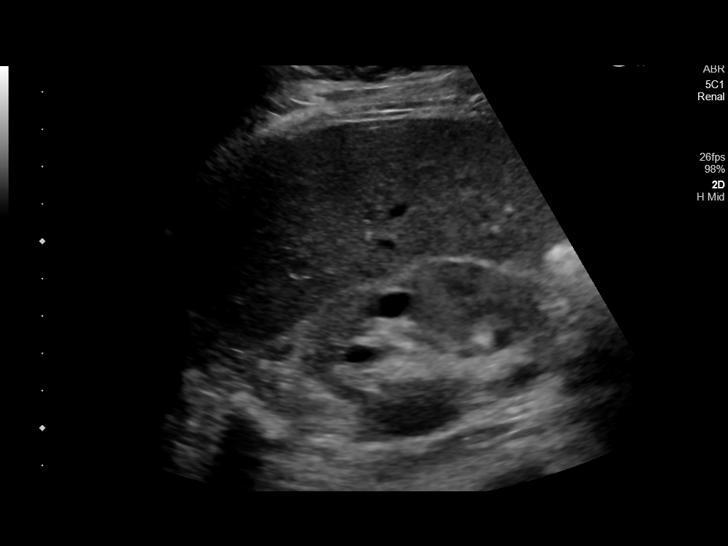
[im 13/39]
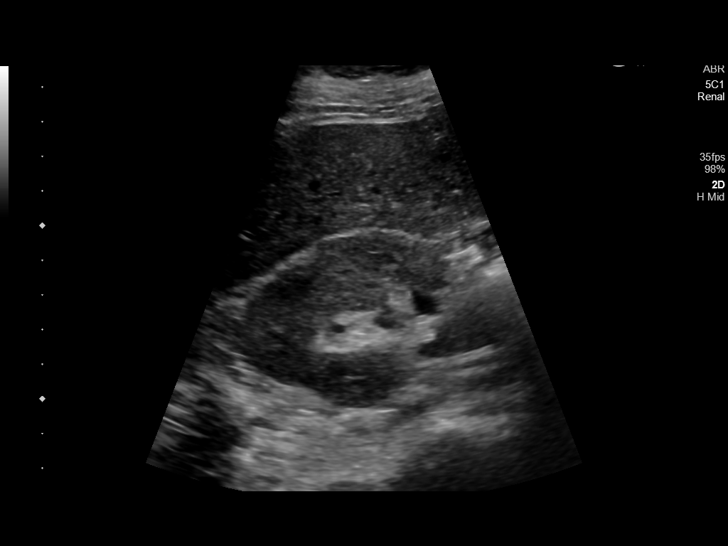
[im 16/39]
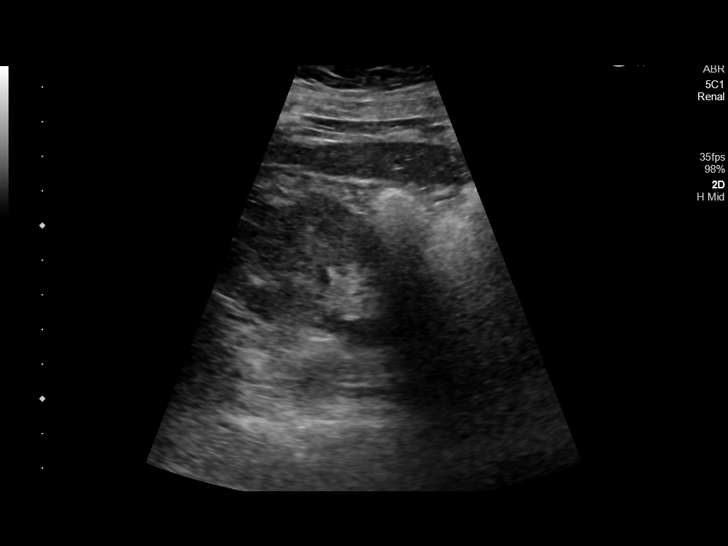
[im 20/39]
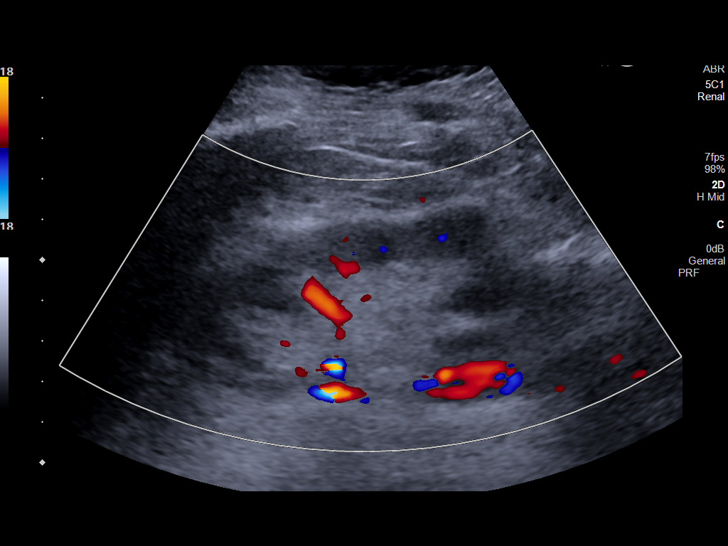
[im 23/39]
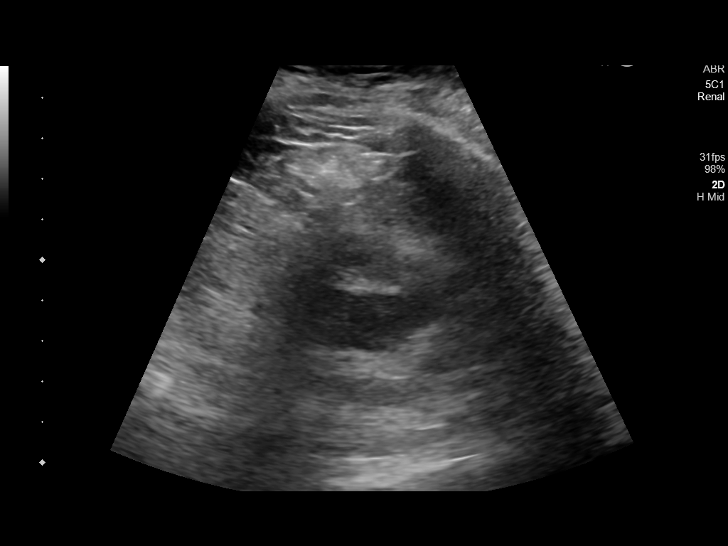
[im 26/39]
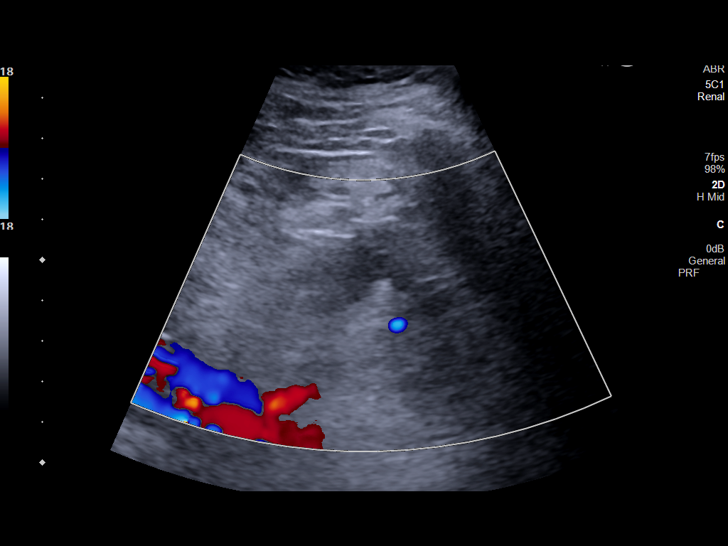
[im 29/39]
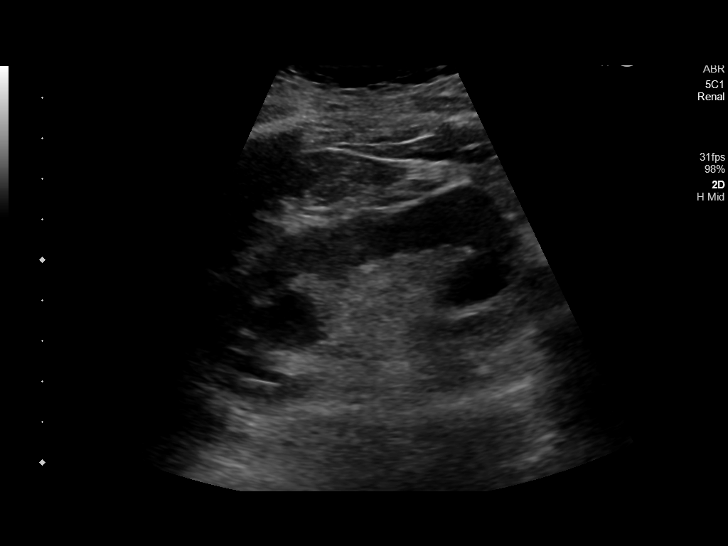
[im 32/39]
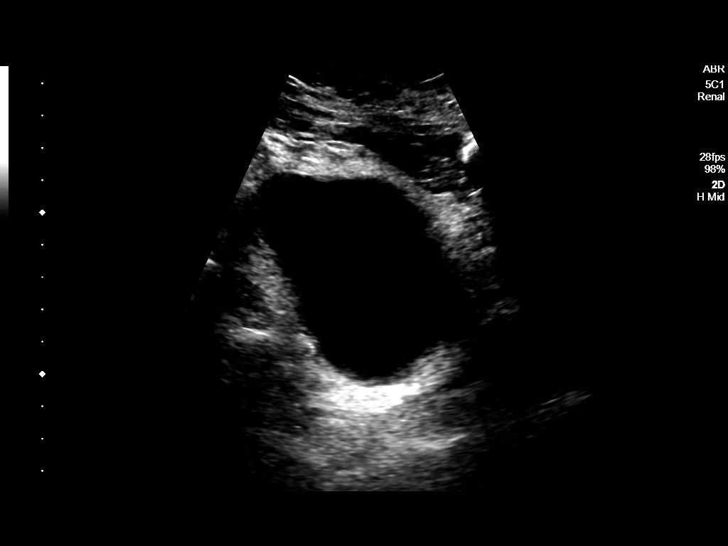
[im 35/39]
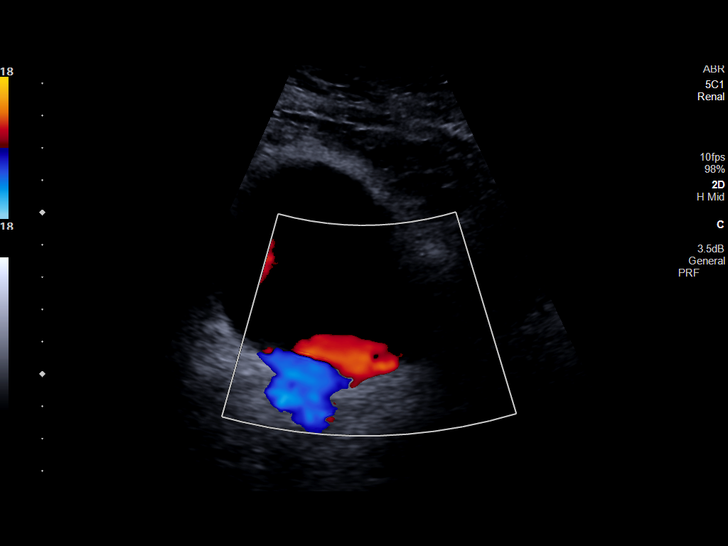
[im 39/39]
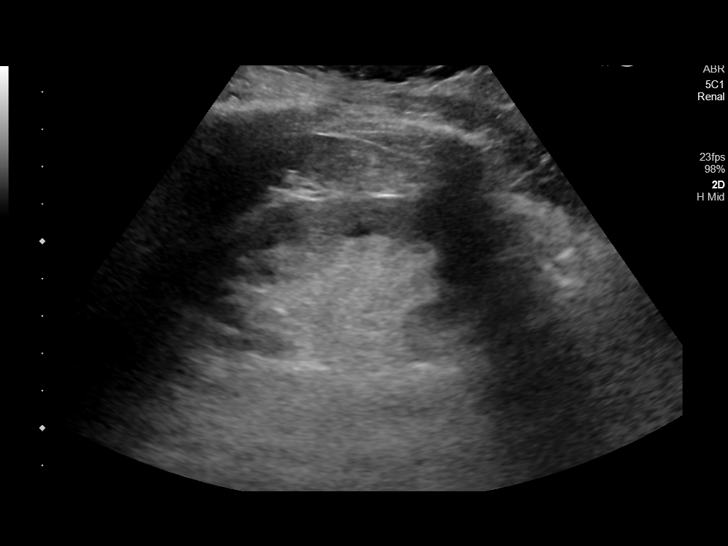

[13 of 25 positions shown; findings below may reference images not displayed]

FINDINGS: Right Kidney:

Renal measurements: 9.2 x 5.0 x 5.7 cm = volume: 138 mL. Prominent
cortical thinning noted at the upper pole. Mildly increased
echogenicity seen within the underlying renal parenchyma. No
nephrolithiasis or hydronephrosis. 1.0 x 0.7 x 0.9 cm simple cyst
noted at the upper pole.

Left Kidney:

Renal measurements: 8.0 x 4.4 x 3.4 cm = volume: 63 mL. Mild diffuse
cortical thinning with increased echogenicity within the renal
parenchyma. No visible nephrolithiasis. No significant
hydronephrosis now seen status post ureterocele incision. No focal
renal mass.

Bladder:

Appears normal for degree of bladder distention. Bilateral jets are
visualized at the bladder.

Other:

None.
IMPRESSION: 1. Interval resolution of previously seen left-sided hydronephrosis
status post ureterocele incision.
2. Scattered cortical thinning with increased echogenicity about the
kidneys bilaterally, consistent with chronic medical renal disease.
3. 1 cm simple cyst at the upper right kidney.

## 2020-06-14 ENCOUNTER — Telehealth: Payer: Self-pay

## 2020-06-14 NOTE — Telephone Encounter (Signed)
-----   Message from Billey Co, MD sent at 06/14/2020  9:56 AM EDT ----- Kermit Balo news, kidney is draining fine on [place] after ureterocele incision surgery a few months ago, keep follow up as scheduled in December, thanks  Nickolas Madrid, MD 06/14/2020

## 2020-06-14 NOTE — Telephone Encounter (Signed)
Called pt informed her of the information below. Pt gave verbal understanding.  

## 2020-11-24 ENCOUNTER — Ambulatory Visit: Payer: Self-pay | Admitting: Urology

## 2020-11-25 ENCOUNTER — Ambulatory Visit: Payer: Self-pay | Admitting: Urology

## 2020-11-26 ENCOUNTER — Encounter: Payer: Self-pay | Admitting: Urology

## 2020-11-29 ENCOUNTER — Ambulatory Visit: Payer: BC Managed Care – PPO | Admitting: Urology

## 2020-11-29 ENCOUNTER — Encounter: Payer: Self-pay | Admitting: Urology

## 2020-11-29 ENCOUNTER — Other Ambulatory Visit: Payer: Self-pay

## 2020-11-29 VITALS — BP 173/82 | HR 83 | Ht 64.0 in | Wt 154.1 lb

## 2020-11-29 DIAGNOSIS — N2889 Other specified disorders of kidney and ureter: Secondary | ICD-10-CM | POA: Diagnosis not present

## 2020-11-29 DIAGNOSIS — N941 Unspecified dyspareunia: Secondary | ICD-10-CM | POA: Diagnosis not present

## 2020-11-29 DIAGNOSIS — N1339 Other hydronephrosis: Secondary | ICD-10-CM

## 2020-11-29 MED ORDER — ESTRADIOL 0.1 MG/GM VA CREA: TOPICAL_CREAM | VAGINAL | 12 refills | Status: DC

## 2020-11-29 NOTE — Progress Notes (Signed)
   11/29/2020 1:11 PM   Alejandra Hill 19-Jan-1961 [id number]  Reason for visit: Follow up left hydronephrosis, ureterocele, bladder biopsy  HPI: I saw [name] in urology clinic today for follow-up after undergoing incision of a left ureterocele.  She is a 59 year old female with CKD and recurrent UTIs who was originally evaluated by Dr. Junious Hill.  She was found to have moderate to severe left hydroureteronephrosis with a left ureterocele on CT on 03/10/2020.  On 03/20/2019 she underwent cystoscopy, bladder biopsy, and incision of left ureterocele.  Pathology of the bladder just showed inflammation and no evidence of malignancy.  She denies any left-sided flank pain since surgery.  She has not had any UTIs.  Renal function is improved with most recent creatinine of 2.5 from sCr of 3.0 prior to surgery.  I personally viewed and interpreted the follow-up renal ultrasound dated 06/11/2020 that shows no evidence of hydronephrosis.  She has had 20 pounds of weight loss over the last few months without etiology, and she is concerned that she may have cancer, especially since her sister was recently diagnosed with gastric cancer.  She was previously followed by oncology for mild lymphadenopathy, and they are planning to see her in the next few months.  I messaged Dr. Tasia Hill in oncology to see if she could move that visit up, and consider repeat imaging based on her weight loss and history of lymphadenopathy.  The patient also reports some pain with sexual activity and occasional blood in the urine.  I recommended a trial of topical estrogen cream for both of these issues.  We may need to consider repeat cystoscopy in the future if she has persistent gross hematuria, but she had a recent cystoscopy negative biopsy which is reassuring.  Messaged oncology to move up follow-up Trial of topical estrogen cream for dyspareunia RTC 3 months symptom check, UA Continue follow-up with nephrology for CKD   Alejandra Co, MD  [facility] [address] [place], [place] [address] [phone]

## 2020-11-29 NOTE — Patient Instructions (Signed)
Estrogen Cream Instruction  Discard applicator  Apply pea sized amount to tip of finger to urethra before bed. Wash hands well after application. Use Monday, Wednesday and Friday

## 2020-11-30 ENCOUNTER — Other Ambulatory Visit: Payer: Self-pay

## 2020-11-30 ENCOUNTER — Telehealth: Payer: Self-pay

## 2020-11-30 DIAGNOSIS — R591 Generalized enlarged lymph nodes: Secondary | ICD-10-CM

## 2020-11-30 NOTE — Telephone Encounter (Signed)
Done.. Pt 03/15/21 lab/MD appts has been moved up as requested Pt was made aware of her sched 12/29/20 labs appt and to RTC on 12/31/20 to be seen by Dr.Yu

## 2020-11-30 NOTE — Telephone Encounter (Signed)
Per MD request, please move appts up with labs prior to MD appt and call pt with new appt details.  (cbc,cmp,ldh, tsh)

## 2020-11-30 NOTE — Telephone Encounter (Signed)
-----   Message from Earlie Server, MD sent at 11/29/2020  4:49 PM EST ----- See below. Please move her appt up with the previously scheduled the labs. Maybe do labs prior.and then MD. Thanks.  ----- Message ----- From: Billey Co, MD Sent: 11/29/2020  11:35 AM EST To: Earlie Server, MD  This is a patient you saw previously for mild lymphadenopathy, and I did a bladder biopsy(negative) and incision of a left ureterocele in March 2021.  She has had 20 pounds of recent unexplained weight loss and is very concerned she could have cancer, has recently had a sister diagnosed with gastric cancer.  I think she has follow-up with you at some point in the next few months, can you move her up sooner and consider repeat imaging?  Thanks  Nickolas Madrid, MD 11/29/2020

## 2020-12-29 ENCOUNTER — Inpatient Hospital Stay: Payer: BLUE CROSS/BLUE SHIELD | Attending: Oncology

## 2020-12-29 ENCOUNTER — Other Ambulatory Visit: Payer: Self-pay

## 2020-12-29 DIAGNOSIS — Z87440 Personal history of urinary (tract) infections: Secondary | ICD-10-CM | POA: Insufficient documentation

## 2020-12-29 DIAGNOSIS — R634 Abnormal weight loss: Secondary | ICD-10-CM | POA: Diagnosis not present

## 2020-12-29 DIAGNOSIS — R590 Localized enlarged lymph nodes: Secondary | ICD-10-CM | POA: Diagnosis present

## 2020-12-29 DIAGNOSIS — R319 Hematuria, unspecified: Secondary | ICD-10-CM

## 2020-12-29 DIAGNOSIS — N184 Chronic kidney disease, stage 4 (severe): Secondary | ICD-10-CM | POA: Insufficient documentation

## 2020-12-29 DIAGNOSIS — R591 Generalized enlarged lymph nodes: Secondary | ICD-10-CM

## 2020-12-29 DIAGNOSIS — K769 Liver disease, unspecified: Secondary | ICD-10-CM | POA: Diagnosis not present

## 2020-12-29 DIAGNOSIS — Z79899 Other long term (current) drug therapy: Secondary | ICD-10-CM | POA: Diagnosis not present

## 2020-12-29 DIAGNOSIS — R3915 Urgency of urination: Secondary | ICD-10-CM | POA: Diagnosis not present

## 2020-12-29 LAB — CBC WITH DIFFERENTIAL/PLATELET
Abs Immature Granulocytes: 0.03 10*3/uL (ref 0.00–0.07)
Basophils Absolute: 0.1 10*3/uL (ref 0.0–0.1)
Basophils Relative: 1 %
Eosinophils Absolute: 0.1 10*3/uL (ref 0.0–0.5)
Eosinophils Relative: 1 %
HCT: 29.9 % — ABNORMAL LOW (ref 36.0–46.0)
Hemoglobin: 9.3 g/dL — ABNORMAL LOW (ref 12.0–15.0)
Immature Granulocytes: 0 %
Lymphocytes Relative: 22 %
Lymphs Abs: 1.8 10*3/uL (ref 0.7–4.0)
MCH: 28.9 pg (ref 26.0–34.0)
MCHC: 31.1 g/dL (ref 30.0–36.0)
MCV: 92.9 fL (ref 80.0–100.0)
Monocytes Absolute: 1.1 10*3/uL — ABNORMAL HIGH (ref 0.1–1.0)
Monocytes Relative: 13 %
Neutro Abs: 5.2 10*3/uL (ref 1.7–7.7)
Neutrophils Relative %: 63 %
Platelets: 299 10*3/uL (ref 150–400)
RBC: 3.22 MIL/uL — ABNORMAL LOW (ref 3.87–5.11)
RDW: 14 % (ref 11.5–15.5)
WBC: 8.2 10*3/uL (ref 4.0–10.5)
nRBC: 0 % (ref 0.0–0.2)

## 2020-12-29 LAB — COMPREHENSIVE METABOLIC PANEL
ALT: 40 U/L (ref 0–44)
AST: 36 U/L (ref 15–41)
Albumin: 3.5 g/dL (ref 3.5–5.0)
Alkaline Phosphatase: 104 U/L (ref 38–126)
Anion gap: 10 (ref 5–15)
BUN: 53 mg/dL — ABNORMAL HIGH (ref 8–23)
CO2: 21 mmol/L — ABNORMAL LOW (ref 22–32)
Calcium: 9.1 mg/dL (ref 8.9–10.3)
Chloride: 106 mmol/L (ref 98–111)
Creatinine, Ser: 2.94 mg/dL — ABNORMAL HIGH (ref 0.44–1.00)
GFR, Estimated: 18 mL/min — ABNORMAL LOW (ref >60)
Glucose, Bld: 98 mg/dL (ref 70–99)
Potassium: 5.3 mmol/L — ABNORMAL HIGH (ref 3.5–5.1)
Sodium: 137 mmol/L (ref 135–145)
Total Bilirubin: 0.5 mg/dL (ref 0.3–1.2)
Total Protein: 8.1 g/dL (ref 6.5–8.1)

## 2020-12-29 LAB — TSH: TSH: 2.52 u[IU]/mL (ref 0.350–4.500)

## 2020-12-29 LAB — URINALYSIS, COMPLETE (UACMP) WITH MICROSCOPIC
Bilirubin Urine: NEGATIVE
Glucose, UA: NEGATIVE mg/dL
Ketones, ur: NEGATIVE mg/dL
Nitrite: NEGATIVE
Protein, ur: 30 mg/dL — AB
RBC / HPF: >50 RBC/hpf — ABNORMAL HIGH (ref 0–5)
Specific Gravity, Urine: 1.008 (ref 1.005–1.030)
WBC, UA: >50 WBC/hpf — ABNORMAL HIGH (ref 0–5)
pH: 6 (ref 5.0–8.0)

## 2020-12-29 LAB — LACTATE DEHYDROGENASE: LDH: 124 U/L (ref 98–192)

## 2020-12-31 ENCOUNTER — Inpatient Hospital Stay: Payer: BLUE CROSS/BLUE SHIELD

## 2020-12-31 ENCOUNTER — Encounter: Payer: Self-pay | Admitting: Oncology

## 2020-12-31 ENCOUNTER — Inpatient Hospital Stay (HOSPITAL_BASED_OUTPATIENT_CLINIC_OR_DEPARTMENT_OTHER): Payer: BLUE CROSS/BLUE SHIELD | Admitting: Oncology

## 2020-12-31 VITALS — BP 142/68 | HR 81 | Temp 96.9°F | Resp 16 | Wt 159.8 lb

## 2020-12-31 DIAGNOSIS — R590 Localized enlarged lymph nodes: Secondary | ICD-10-CM | POA: Diagnosis not present

## 2020-12-31 DIAGNOSIS — R319 Hematuria, unspecified: Secondary | ICD-10-CM

## 2020-12-31 DIAGNOSIS — N184 Chronic kidney disease, stage 4 (severe): Secondary | ICD-10-CM

## 2020-12-31 DIAGNOSIS — R591 Generalized enlarged lymph nodes: Secondary | ICD-10-CM

## 2020-12-31 NOTE — Progress Notes (Signed)
Hematology/Oncology Consult note [facility] Telephone:([phone] [phone]   Patient Care Team: Donnamarie Rossetti, PA-C as PCP - General (Family Medicine)  REFERRING PROVIDER: Grayland Ormond Hestle,*  CHIEF COMPLAINTS/REASON FOR VISIT:  Evaluation of lymphadenopathy  HISTORY OF PRESENTING ILLNESS:   Alejandra Hill is a  60 y.o.  female with PMH listed below was seen in consultation at the request of  Donnamarie Rossetti,*  for evaluation of lymphadenopathy Patient previously following with [facility] for most of her care.  She recently wants to move her care locally. She was seen by primary care provider [facility] recently. Extensive medical records review via care everywhere was performed by me. She has a history of recurrent UTI, chronic dysuria and urinary frequency.  She was seen by urology previously. She also had a history of retroperitoneal lymphadenopathy first was on CT scan 03/05/2018 At that time CT showed multiple enlarged retroperitoneal lymph nodes which are suspicious for malignancy such as lymphoma.  Retroperitoneal lymph nodes are amendable to percutaneous biopsy.  Nonspecific 1.2 cm right hepatic lobe lesion. 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  Lesion was incompletely evaluated on a noncontrast study.  Differential is hemangioma due to stability and precontrast T2 signal. Patient was seen by [facility] hematology oncology on 01/24/2019.  Was recommended to have a follow-up MRI July 2020.  Patient did not have image follow-up done since then.  Patient denies any unintentional weight loss, night sweats, fatigue.  Fever or chills. She is currently on a course of antibiotics for dysuria/urgency treating for UTI.  Her urine on 01/27/2020 showed large amount of blood, positive leukocyte Esterace.  Patient has chronic kidney  disease, recent creatinine was 3, estimated GFR 19.  She was last seen by her [facility] nephrology Dr. Fredirick Maudlin on 06/04/2019.  Possible need of renal biopsy was discussed at that time. Patient's work-up were positive for a low positive titer ANA and ANCA titers. SPEP negative.  Recurrent UTI, previously seen by [facility] urology 11/08/2017  INTERVAL HISTORY Alejandra Hill is a 60 y.o. female who has above history reviewed by me today presents for follow up visit for weight loss, lymphadenopathy Problems and complaints are listed below: Patient has intermittent hematuria and was recently seen by urology Dr. Diamantina Providence on 11/29/2020. Was noticed to have 20 pound weight loss and patient was informed to contact us to move her appointment earlier. Today her weight has gone up a few pounds however still significant lower than her weight during her last visit with me early 2021. Hematuria, patient had cystoscopy done in 2021 which was negative. Urology plans to repeat cystoscopy if she has persistent gross hematuria. Denies any nausea vomiting diarrhea, abdominal pain. Positive urinary urgency and intermittent hematuria. Her sister was diagnosed with gastric cancer   Review of Systems  Constitutional: Negative for appetite change, chills, fatigue and fever.  HENT:   Negative for hearing loss and voice change.   Eyes: Negative for eye problems.  Respiratory: Negative for chest tightness and cough.   Cardiovascular: Negative for chest pain.  Gastrointestinal: Negative for abdominal distention, abdominal pain and blood in stool.  Endocrine: Negative for hot flashes.  Genitourinary: Positive for dysuria and frequency. Negative for difficulty urinating.   Musculoskeletal: Negative for arthralgias.  Skin: Negative for itching and rash.  Neurological: Negative for extremity weakness.  Hematological: Negative for adenopathy.  Psychiatric/Behavioral: Negative for confusion.  MEDICAL HISTORY:  Past Medical History:   Diagnosis Date  . Allergy   . Anemia   . GERD (gastroesophageal reflux disease)   . Overactive bladder     SURGICAL HISTORY: Past Surgical History:  Procedure Laterality Date  . BREAST SURGERY     fibrocystic  . CYSTOSCOPY W/ RETROGRADES Left 03/19/2020   Procedure: CYSTOSCOPY WITH RETROGRADE PYELOGRAM;  Surgeon: Billey Co, MD;  Location: [facility] ORS;  Service: Urology;  Laterality: Left;  . CYSTOSCOPY WITH BIOPSY N/A 03/19/2020   Procedure: CYSTOSCOPY WITH bladder BIOPSY and fulgeration;  Surgeon: Billey Co, MD;  Location: [facility] ORS;  Service: Urology;  Laterality: N/A;  . CYSTOSCOPY WITH URETEROSCOPY Left 03/19/2020   Procedure: CYSTOSCOPY WITH URETEROSCOPY;  Surgeon: Billey Co, MD;  Location: [facility] ORS;  Service: Urology;  Laterality: Left;  . LAPAROTOMY     ectopic    SOCIAL HISTORY: Social History   Socioeconomic History  . Marital status: Single    Spouse name: Not on file  . Number of children: Not on file  . Years of education: Not on file  . Highest education level: Not on file  Occupational History  . Not on file  Tobacco Use  . Smoking status: Never Smoker  . Smokeless tobacco: Never Used  Vaping Use  . Vaping Use: Never used  Substance and Sexual Activity  . Alcohol use: Not Currently  . Drug use: No  . Sexual activity: Not Currently    Birth control/protection: None, Post-menopausal  Other Topics Concern  . Not on file  Social History Narrative   Lives alone in [place]. Works as Sports coach.   Social Determinants of Health   Financial Resource Strain: Not on file  Food Insecurity: Not on file  Transportation Needs: Not on file  Physical Activity: Not on file  Stress: Not on file  Social Connections: Not on file  Intimate Partner Violence: Not on file    FAMILY HISTORY: Family History  Problem Relation Age of Onset  . Cancer Mother        pancreatic  . Hypertension Mother   . Cancer Father        lung  . Hypertension  Father   . Throat cancer Father   . Cancer Maternal Grandmother        bone  . Cancer Paternal Grandmother        breast  . Breast cancer Sister   . Stomach cancer Sister     ALLERGIES:  has No Known Allergies.  MEDICATIONS:  Current Outpatient Medications  Medication Sig Dispense Refill  . calcium citrate-vitamin D (CITRACAL+D) 315-200 MG-UNIT per tablet Take 1 tablet by mouth daily.     Marland Kitchen estradiol (ESTRACE) 0.1 MG/GM vaginal cream Estrogen Cream Instruction Discard applicator Apply pea sized amount to tip of finger to urethra before bed. Wash hands well after application. Use Monday, Wednesday and Friday 42.5 g 12  . ferrous sulfate 325 (65 FE) MG tablet Take 650 mg by mouth daily with breakfast.     . fexofenadine (ALLEGRA) 180 MG tablet Take 180 mg by mouth daily as needed for allergies or rhinitis.    [name] (GARLIC/EPA PO) Take 1 tablet by mouth daily.     . Multiple Vitamins-Minerals (ALIVE WOMENS ENERGY) TABS Take 1 tablet by mouth daily.     Marland Kitchen omeprazole (PRILOSEC) 20 MG capsule Take 20 mg by mouth daily.    Marland Kitchen Specialty Vitamins Products (ONE-A-DAY CHOLESTEROL PLUS PO) Take 2 tablets by  mouth in the morning and at bedtime.     No current facility-administered medications for this visit.     PHYSICAL EXAMINATION: ECOG PERFORMANCE STATUS: 1 - Symptomatic but completely ambulatory Vitals:   12/31/20 0941  BP: (!) 142/68  Pulse: 81  Resp: 16  Temp: (!) 96.9 F (36.1 C)  SpO2: 100%   Filed Weights   12/31/20 0941  Weight: 159 lb 12.8 oz (72.5 kg)    Physical Exam Constitutional:      General: She is not in acute distress. HENT:     Head: Normocephalic and atraumatic.  Eyes:     General: No scleral icterus. Cardiovascular:     Rate and Rhythm: Normal rate and regular rhythm.     Heart sounds: Normal heart sounds.  Pulmonary:     Effort: Pulmonary effort is normal. No respiratory distress.     Breath sounds: No wheezing.  Abdominal:     General:  Bowel sounds are normal. There is no distension.     Palpations: Abdomen is soft.  Musculoskeletal:        General: No deformity. Normal range of motion.     Cervical back: Normal range of motion and neck supple.  Skin:    General: Skin is warm and dry.     Findings: No erythema or rash.  Neurological:     Mental Status: She is alert and oriented to person, place, and time. Mental status is at baseline.     Cranial Nerves: No cranial nerve deficit.     Coordination: Coordination normal.  Psychiatric:        Mood and Affect: Mood normal.     LABORATORY DATA:  I have reviewed the data as listed Lab Results  Component Value Date   WBC 8.2 12/29/2020   HGB 9.3 (L) 12/29/2020   HCT 29.9 (L) 12/29/2020   MCV 92.9 12/29/2020   PLT 299 12/29/2020   Recent Labs    12/29/20 1012  NA 137  K 5.3*  CL 106  CO2 21*  GLUCOSE 98  BUN 53*  CREATININE 2.94*  CALCIUM 9.1  GFRNONAA 18*  PROT 8.1  ALBUMIN 3.5  AST 36  ALT 40  ALKPHOS 104  BILITOT 0.5   Iron/TIBC/Ferritin/ %Sat    Component Value Date/Time   IRON 56 12/03/2012 1223   TIBC 356 12/03/2012 1223   FERRITIN 97 12/03/2012 1223   IRONPCTSAT 16 12/03/2012 1223      RADIOGRAPHIC STUDIES: I have personally reviewed the radiological images as listed and agreed with the findings in the report. No results found.    ASSESSMENT & PLAN:  1. Lymphadenopathy   2. Hematuria, unspecified type   3. Stage 4 chronic kidney disease (HCC)    #Retroperitoneal lymphadenopathy. 03/24/2020, CT abdomen pelvis without contrast-compared with 01/10/2019 MRI abdomen Slightly enlarged left retroperitoneal lymph node measuring 1.4 cm compared to 1.3 cm on 01/10/2019 1.2 left Aortic lymph node is also unchanged Peripheral flow cytometry negative, LDH negative. Patient also has a small liver lesion which was indeterminate on previous MRI. There was plan to repeat image this year for follow-up.  #Unintentional weight loss, liver lesion,  retroperitoneal lymphadenopathy I will repeat imaging with MRI abdomen pelvis with and without contrast. Patient has chronic kidney disease with EGFR slightly above 15.   #Hematuria, previous cystoscopy showed negative biopsy. Continue follow-up with urology to consider cystoscopy. Her kidney function also gradually worsening. Recommend patient to also follow-up with nephrology who previously recommended kidney biopsy. Previously  multiple myeloma panel showed a negative M protein and normal free light chain ratio. Check urine protein electrophoresis today. I will also send urine for culture. #Anemia, likely due to blood loss as well as anemia secondary chronic kidney disease. Follow-up to be determined  Orders Placed This Encounter  Procedures  . Urine culture    Standing Status:   Future    Number of Occurrences:   1    Standing Expiration Date:   12/31/2021  . MR ABDOMEN W WO CONTRAST    Standing Status:   Future    Standing Expiration Date:   12/31/2021    Order Specific Question:   If indicated for the ordered procedure, I authorize the administration of contrast media per Radiology protocol    Answer:   Yes    Order Specific Question:   What is the patient's sedation requirement?    Answer:   No Sedation    Order Specific Question:   Does the patient have a pacemaker or implanted devices?    Answer:   No    Order Specific Question:   Preferred imaging location?    Answer:   [facility] (table limit - 550lbs)  . MR PELVIS W WO CONTRAST    Standing Status:   Future    Standing Expiration Date:   12/31/2021    Order Specific Question:   If indicated for the ordered procedure, I authorize the administration of contrast media per Radiology protocol    Answer:   Yes    Order Specific Question:   What is the patient's sedation requirement?    Answer:   No Sedation    Order Specific Question:   Does the patient have a pacemaker or implanted devices?    Answer:   No    Order Specific  Question:   Preferred imaging location?    Answer:   [facility] (table limit - 550lbs)  . Protein Electro, Random Urine    Standing Status:   Future    Number of Occurrences:   1    Standing Expiration Date:   12/31/2021    All questions were answered. The patient knows to call the clinic with any problems questions or concerns.  cc Donnamarie Rossetti,*    Return of visit: To be determined Earlie Server, MD, PhD Hematology Oncology [facility] Pager- [phone] 12/31/2020

## 2020-12-31 NOTE — Progress Notes (Signed)
Patient denies new problems/concerns today.   °

## 2021-01-01 LAB — URINE CULTURE

## 2021-01-03 LAB — PROTEIN ELECTRO, RANDOM URINE
Albumin ELP, Urine: 34 %
Alpha-1-Globulin, U: 3 %
Alpha-2-Globulin, U: 12.2 %
Beta Globulin, U: 23 %
Gamma Globulin, U: 27.8 %
Total Protein, Urine: 60 mg/dL

## 2021-01-04 ENCOUNTER — Encounter: Payer: Self-pay | Admitting: Oncology

## 2021-01-10 ENCOUNTER — Ambulatory Visit: Admission: RE | Admit: 2021-01-10 | Payer: BLUE CROSS/BLUE SHIELD | Source: Ambulatory Visit

## 2021-01-10 ENCOUNTER — Other Ambulatory Visit: Payer: Self-pay

## 2021-01-10 ENCOUNTER — Ambulatory Visit
Admission: RE | Admit: 2021-01-10 | Discharge: 2021-01-10 | Disposition: A | Discharge status: Home / Self Care | Payer: BLUE CROSS/BLUE SHIELD | Source: Ambulatory Visit | Attending: Oncology | Admitting: Oncology

## 2021-01-10 DIAGNOSIS — R319 Hematuria, unspecified: Secondary | ICD-10-CM | POA: Diagnosis present

## 2021-01-10 DIAGNOSIS — R591 Generalized enlarged lymph nodes: Secondary | ICD-10-CM | POA: Diagnosis not present

## 2021-01-10 IMAGING — MR MR ABDOMEN WO/W CM
18 series · 48 of 48 positions shown · IV contrast (gadavist)
Comparison: Noncontrast CT on [DATE]

CLINICAL DATA: Hematuria. Recurrent urinary tract infections.
Follow-up left hydronephrosis and abdominal lymphadenopathy.

EXAM:
MRI ABDOMEN AND PELVIS WITHOUT AND WITH CONTRAST
TECHNIQUE: Multiplanar multisequence MR imaging of the abdomen and pelvis was
performed both before and after the administration of intravenous
contrast.
CONTRAST:  7.5mL GADAVIST GADOBUTROL 1 MMOL/ML IV SOLN

[Series 4: T2 · coronal · 6.0mm · 1.06mm/px · 2 of 30 slices shown (1 of 2)]
[im 1/30]
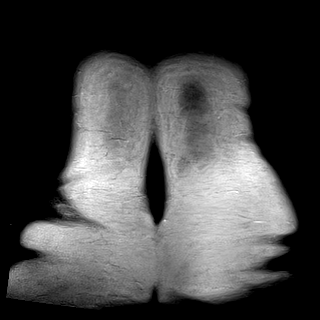
[im 30/30]
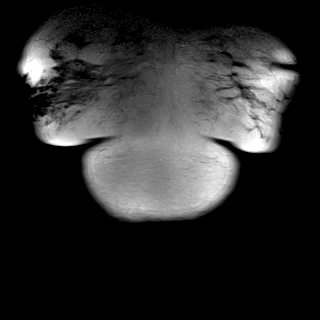

[Series 5: T2 · axial · 6.0mm · 1.19mm/px · z∈[-77,+146]mm · 2 of 32 slices shown (2 of 2)]
[im 1/32]
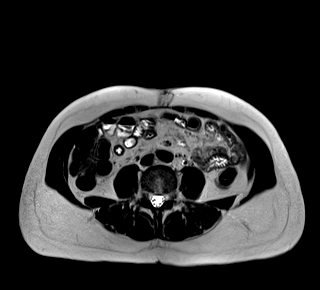
[im 32/32]
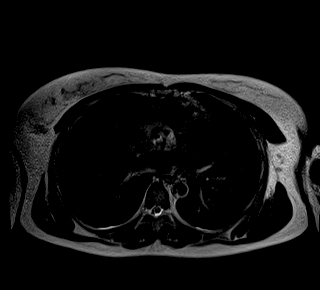

[Series 6: T2 fat-sat · axial · 6.0mm · 1.19mm/px · z∈[-77,+146]mm · 2 of 32 slices shown]
[im 1/32]
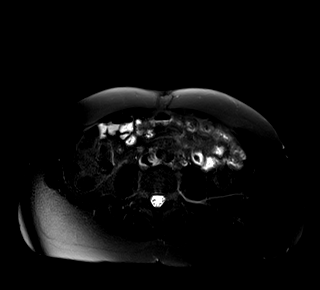
[im 32/32]
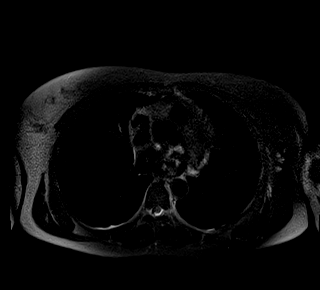

[Series 7: bSSFP · axial · 6.0mm · 0.74mm/px · 1 of 32 slices shown]
[im 1/32]
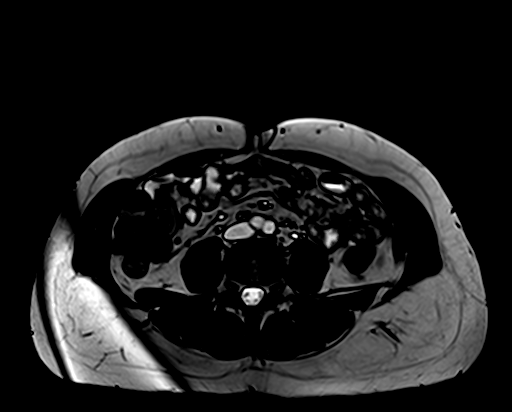

[Series 8: ax dwi_tracew · axial · 6.0mm · 1.42mm/px · z∈[-78,+159]mm · 4 of 102 slices shown]
[im 1/102]
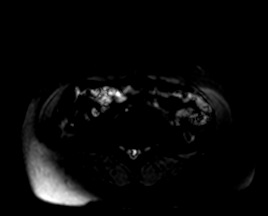
[im 34/102]
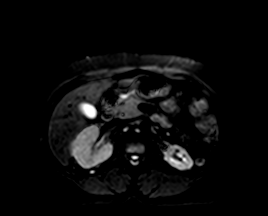
[im 68/102]
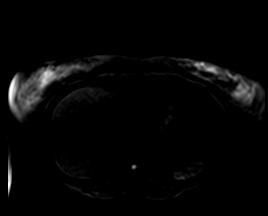
[im 102/102]
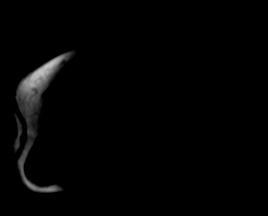

[Series 9: ax dwi_adc · axial · 6.0mm · 1.42mm/px · 1 of 34 slices shown]
[im 1/34]
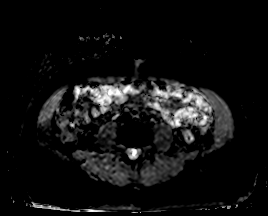

[Series 10: T1 fat-sat · axial · non-contrast · 3.0mm · 1.19mm/px · z∈[-84,+153]mm · 3 of 80 slices shown (1 of 3)]
[im 1/80]
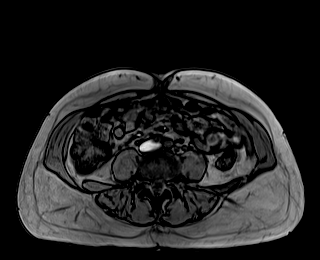
[im 40/80]
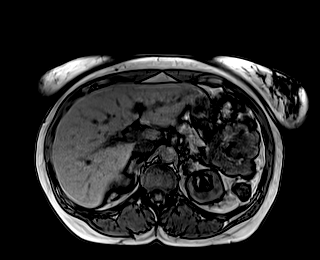
[im 80/80]
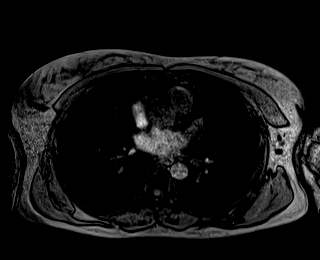

[Series 11: T1 fat-sat · axial · non-contrast · 3.0mm · 1.19mm/px · z∈[-84,+153]mm · 3 of 80 slices shown (2 of 3)]
[im 1/80]
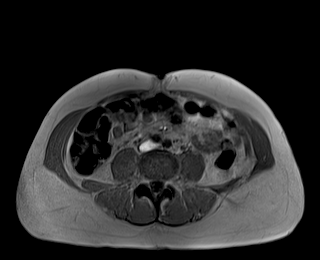
[im 40/80]
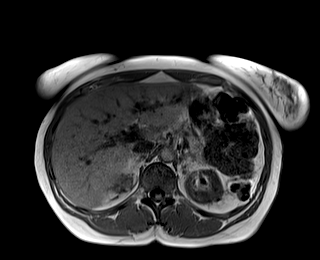
[im 80/80]
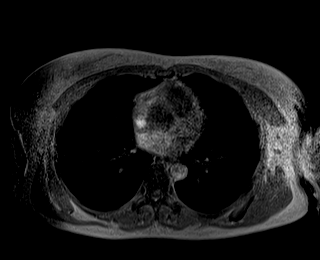

[Series 13: T1 fat-sat · axial · non-contrast · 3.0mm · 1.19mm/px · z∈[-84,+153]mm · 3 of 80 slices shown (3 of 3)]
[im 1/80]
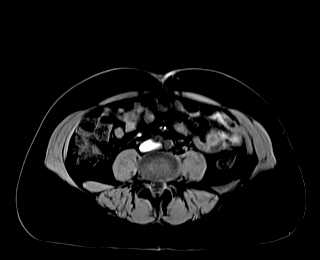
[im 40/80]
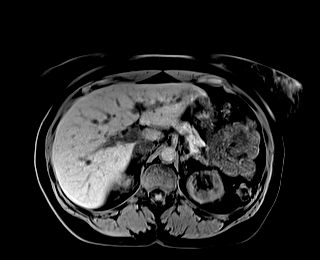
[im 80/80]
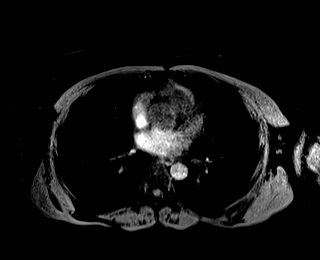

[Series 16: T1 dynamic fat-sat post-contrast · axial · 3.0mm · 1.19mm/px · z∈[-84,+153]mm · 3 of 80 slices shown (1 of 8)]
[im 1/80]
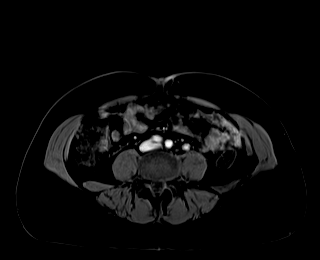
[im 40/80]
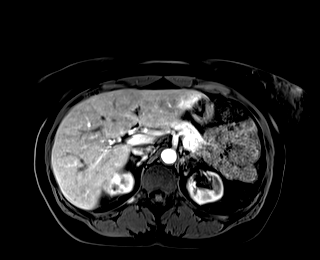
[im 80/80]
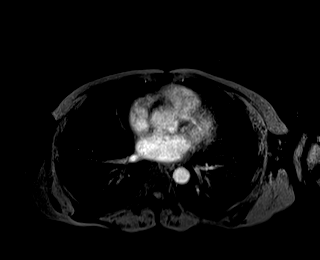

[Series 17: T1 dynamic fat-sat post-contrast · axial · 3.0mm · 1.19mm/px · z∈[-84,+153]mm · 3 of 80 slices shown (2 of 8)]
[im 1/80]
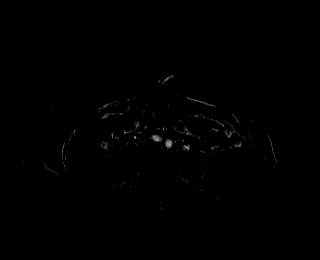
[im 40/80]
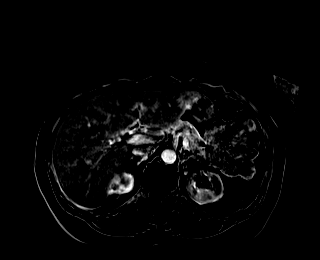
[im 80/80]
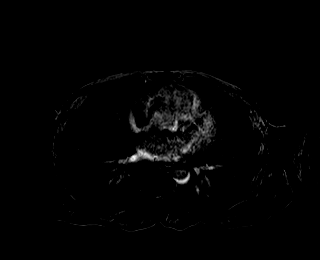

[Series 20: T1 dynamic fat-sat post-contrast · axial · 3.0mm · 1.19mm/px · z∈[-84,+153]mm · 3 of 80 slices shown (3 of 8)]
[im 1/80]
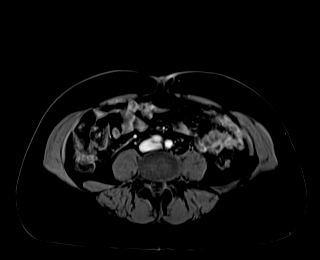
[im 40/80]
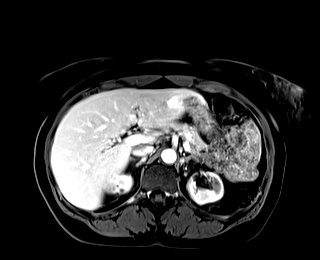
[im 80/80]
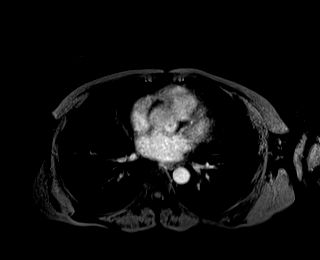

[Series 21: T1 dynamic fat-sat post-contrast · axial · 3.0mm · 1.19mm/px · z∈[-84,+153]mm · 3 of 80 slices shown (4 of 8)]
[im 1/80]
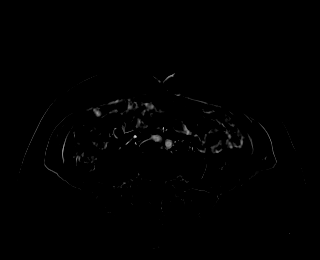
[im 40/80]
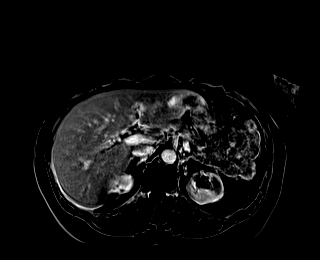
[im 80/80]
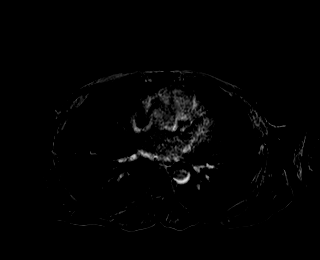

[Series 24: T1 dynamic fat-sat post-contrast · axial · 3.0mm · 1.19mm/px · z∈[-84,+153]mm · 3 of 80 slices shown (5 of 8)]
[im 1/80]
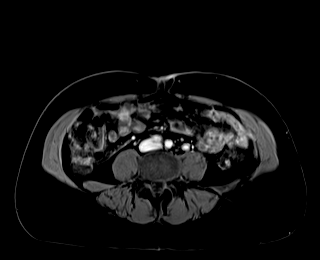
[im 40/80]
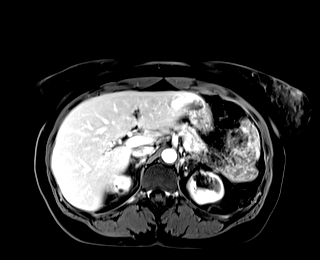
[im 80/80]
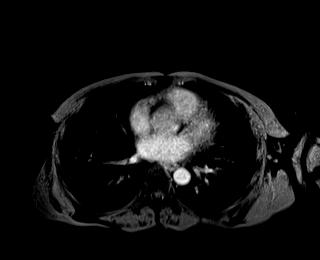

[Series 25: T1 dynamic fat-sat post-contrast · axial · 3.0mm · 1.19mm/px · z∈[-84,+153]mm · 3 of 80 slices shown (6 of 8)]
[im 1/80]
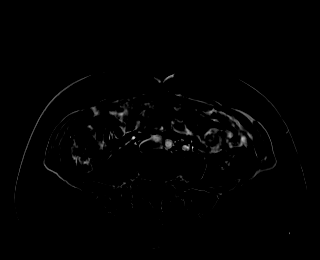
[im 40/80]
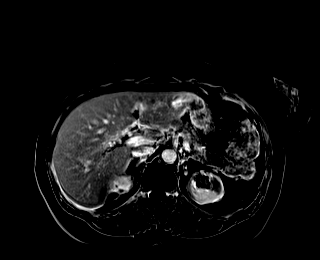
[im 80/80]
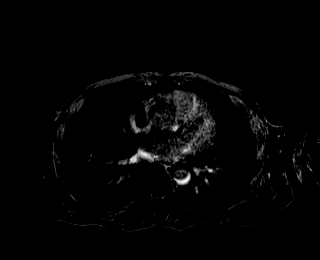

[Series 26: T1 dynamic post-contrast · coronal · 3.0mm · 1.31mm/px · 3 of 72 slices shown]
[im 1/72]
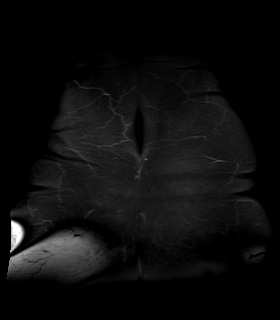
[im 36/72]
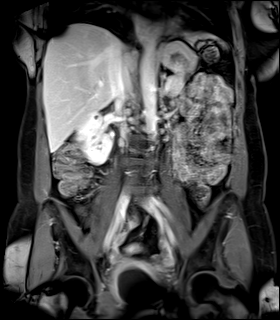
[im 72/72]
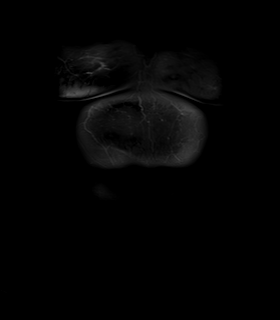

[Series 29: T1 dynamic fat-sat post-contrast · axial · 3.0mm · 1.19mm/px · z∈[-84,+153]mm · 3 of 80 slices shown (7 of 8)]
[im 1/80]
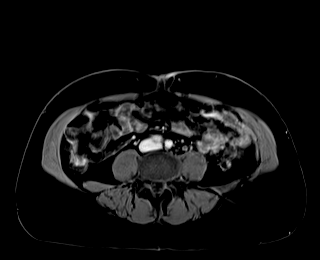
[im 40/80]
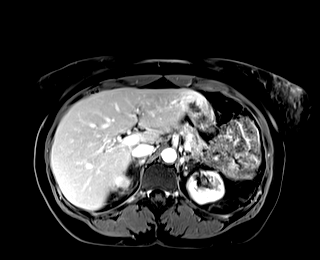
[im 80/80]
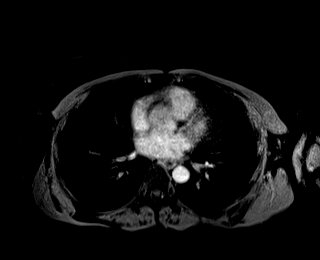

[Series 30: T1 dynamic fat-sat post-contrast · axial · 3.0mm · 1.19mm/px · z∈[-84,+153]mm · 3 of 80 slices shown (8 of 8)]
[im 1/80]
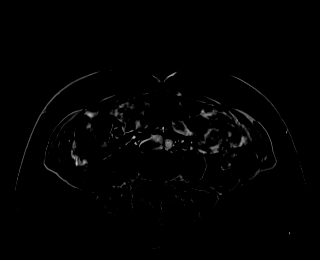
[im 40/80]
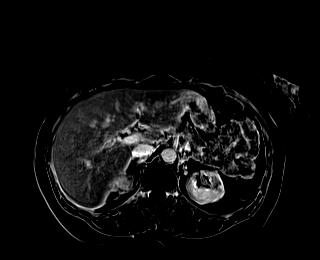
[im 80/80]
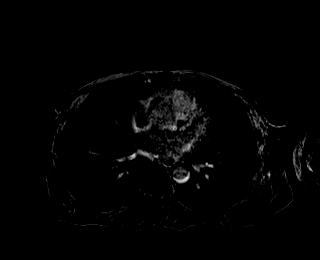

[48 of 48 positions shown; findings below may reference images not displayed]

FINDINGS: COMBINED FINDINGS FOR BOTH MR ABDOMEN AND PELVIS

Lower Chest: No acute findings.

Hepatobiliary: A 1 cm benign hemangioma is seen in the anterior
right hepatic lobe. No other liver lesions identified. Gallbladder
is unremarkable. No evidence of biliary ductal dilatation or
choledocholithiasis.

Pancreas:  No mass or inflammatory changes.

Spleen: Within normal limits in size and appearance.

Adrenals/Urinary Tract: Normal adrenal glands. Multiple tiny renal
cysts are seen bilaterally. Moderate left renal atrophy is noted as
well as bilateral renal parenchymal scarring. No evidence of renal
masses or hydronephrosis. Diffuse bladder wall thickening is again
seen, without evidence of focal mass, consistent with cystitis.

Stomach/Bowel: No evidence of obstruction, inflammatory process or
abnormal fluid collections.

Vascular/Lymphatic: No pathologically enlarged lymph nodes. No
abdominal aortic aneurysm.

Reproductive: Multiple small uterine fibroids are seen measuring up
to 1.7 cm. Adnexal regions are unremarkable.

Other:  None.

Musculoskeletal:  No suspicious bone lesions identified.
IMPRESSION: Moderate left renal atrophy and bilateral renal parenchymal
scarring. No evidence of renal mass.

Resolution of left hydronephrosis since previous study.

Stable diffuse bladder wall thickening, consistent with chronic
cystitis.

Multiple small uterine fibroids measuring up to 1.7 cm.

Small benign hepatic hemangioma.

## 2021-01-10 IMAGING — MR MR PELVIS WO/W CM
7 series · 48 of 48 positions shown · IV contrast (gadavist)
Comparison: Noncontrast CT on [DATE]

CLINICAL DATA: Hematuria. Recurrent urinary tract infections.
Follow-up left hydronephrosis and abdominal lymphadenopathy.

EXAM:
MRI ABDOMEN AND PELVIS WITHOUT AND WITH CONTRAST
TECHNIQUE: Multiplanar multisequence MR imaging of the abdomen and pelvis was
performed both before and after the administration of intravenous
contrast.
CONTRAST:  7.5mL GADAVIST GADOBUTROL 1 MMOL/ML IV SOLN

[Series 4: T2 · coronal · 6.0mm · 1.19mm/px · 4 of 30 slices shown (1 of 3)]
[im 1/30]
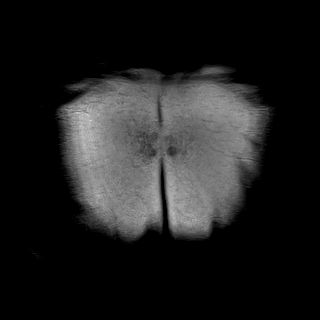
[im 10/30]
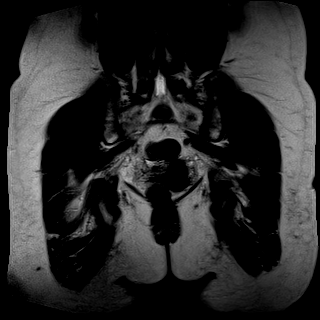
[im 20/30]
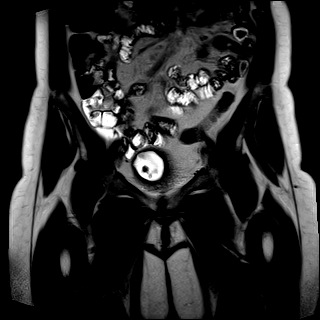
[im 30/30]
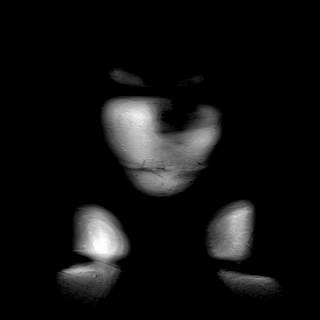

[Series 5: T2 · axial · 6.0mm · 1.19mm/px · z∈[-344,-63]mm · 5 of 40 slices shown (2 of 3)]
[im 1/40]
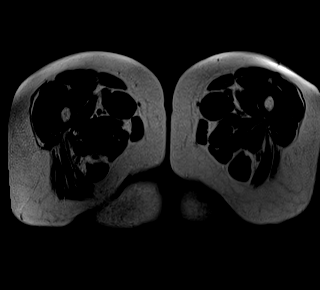
[im 10/40]
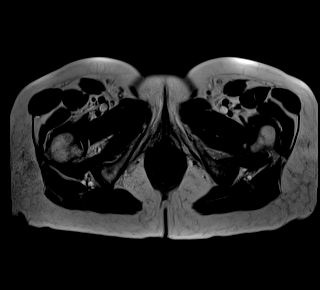
[im 20/40]
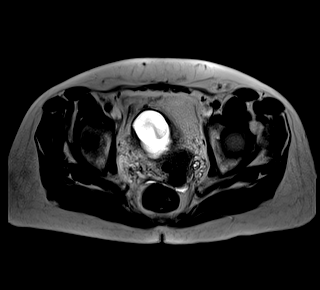
[im 30/40]
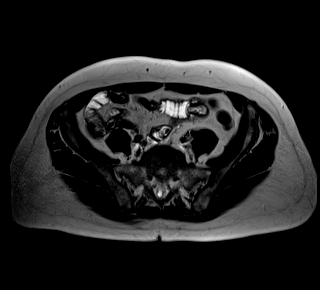
[im 40/40]
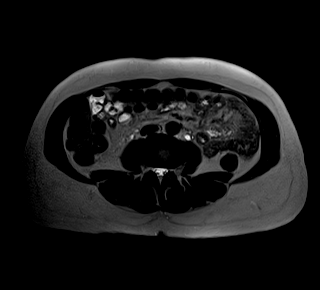

[Series 6: T2 · sagittal · 6.0mm · 1.19mm/px · 3 of 30 slices shown (3 of 3)]
[im 1/30]
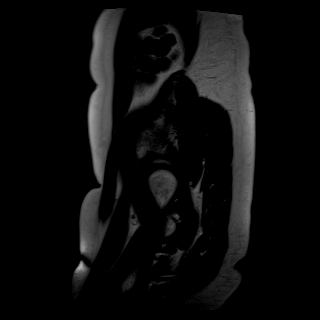
[im 15/30]
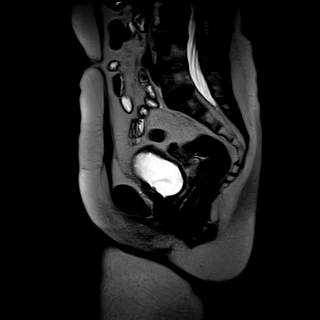
[im 30/30]
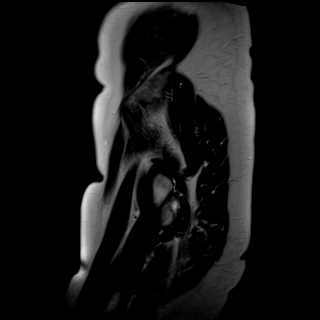

[Series 7: T1 fat-sat · axial · 3.0mm · 1.19mm/px · z∈[-334,-73]mm · 9 of 88 slices shown (1 of 4)]
[im 1/88]
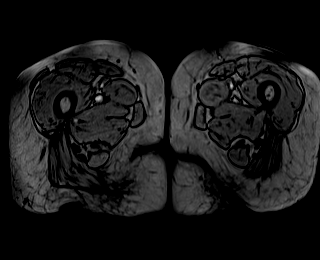
[im 11/88]
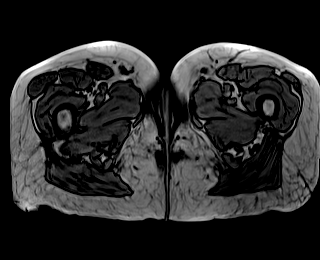
[im 22/88]
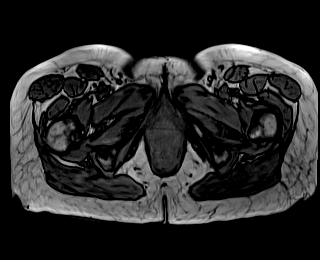
[im 33/88]
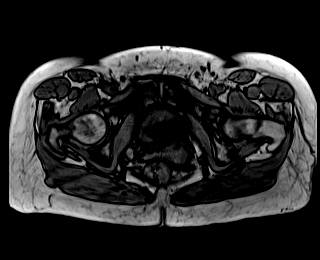
[im 44/88]
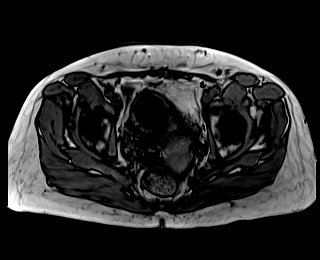
[im 55/88]
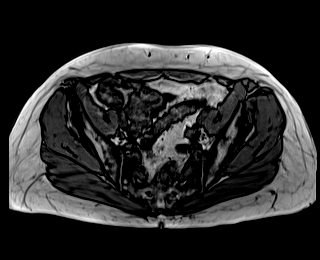
[im 66/88]
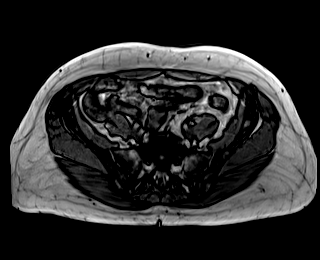
[im 77/88]
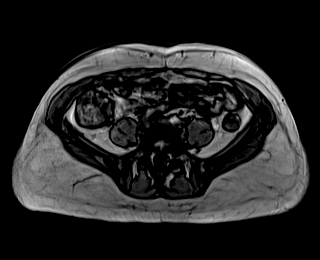
[im 88/88]
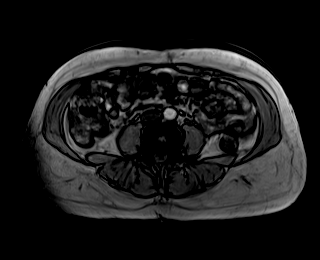

[Series 8: T1 fat-sat · axial · 3.0mm · 1.19mm/px · z∈[-334,-73]mm · 9 of 88 slices shown (2 of 4)]
[im 1/88]
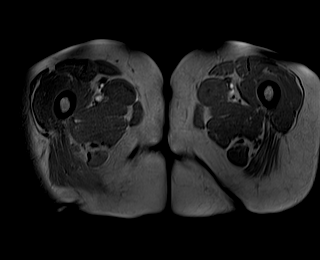
[im 11/88]
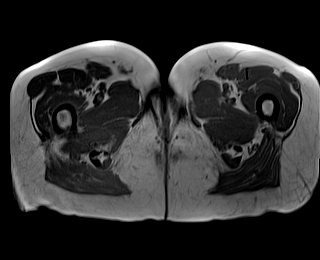
[im 22/88]
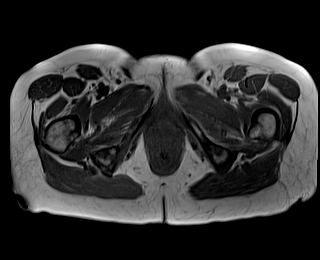
[im 33/88]
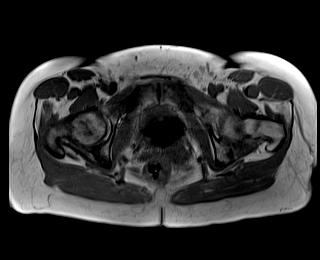
[im 44/88]
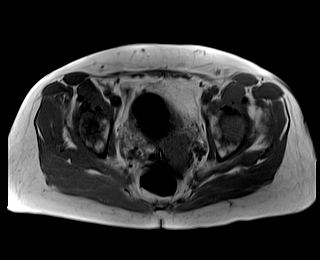
[im 55/88]
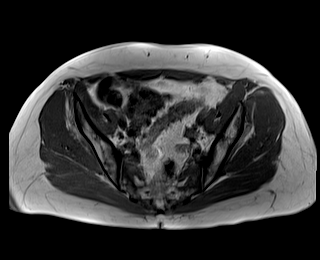
[im 66/88]
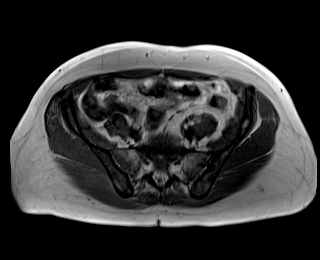
[im 77/88]
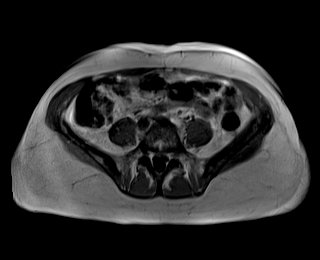
[im 88/88]
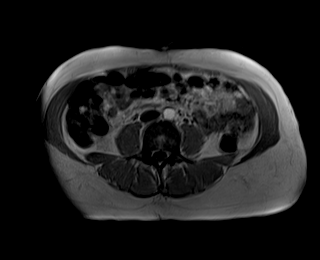

[Series 10: T1 fat-sat · axial · 3.0mm · 1.19mm/px · z∈[-334,-73]mm · 9 of 88 slices shown (3 of 4)]
[im 1/88]
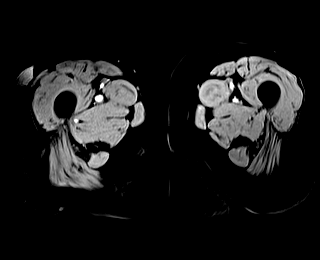
[im 11/88]
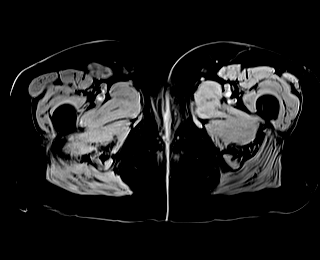
[im 22/88]
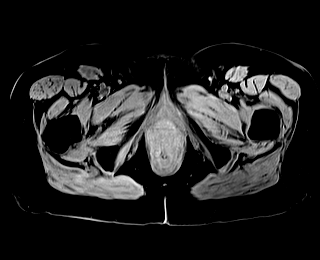
[im 33/88]
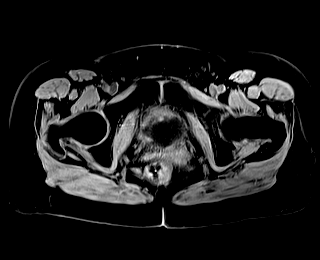
[im 44/88]
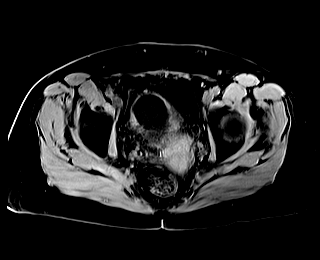
[im 55/88]
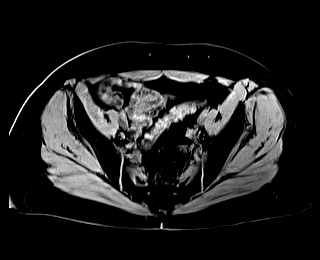
[im 66/88]
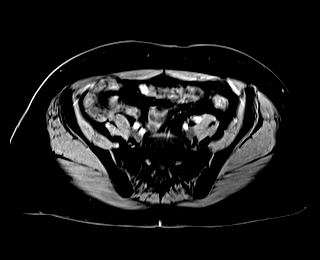
[im 77/88]
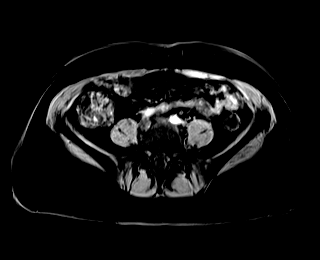
[im 88/88]
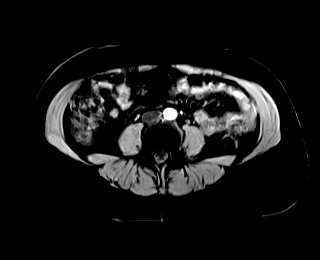

[Series 12: T1 fat-sat · axial · 3.0mm · 1.19mm/px · z∈[-334,-73]mm · 9 of 88 slices shown (4 of 4)]
[im 1/88]
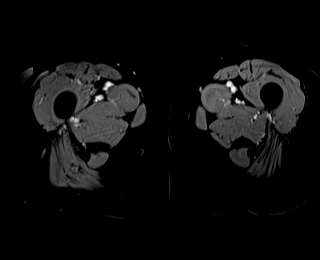
[im 11/88]
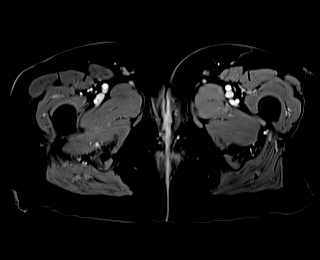
[im 22/88]
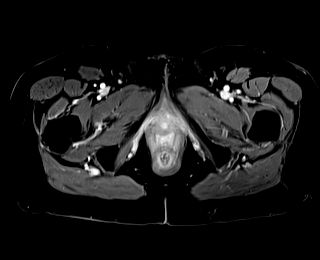
[im 33/88]
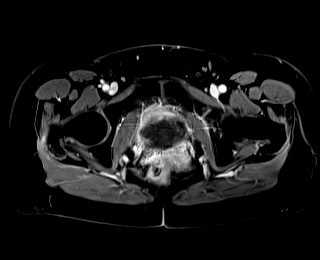
[im 44/88]
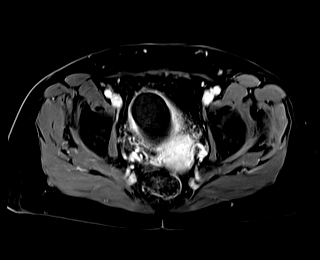
[im 55/88]
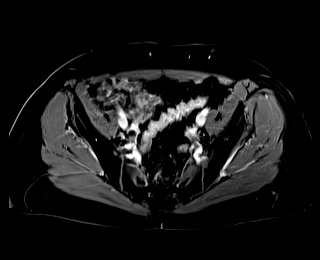
[im 66/88]
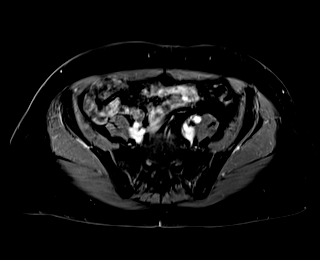
[im 77/88]
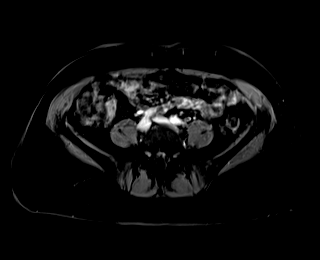
[im 88/88]
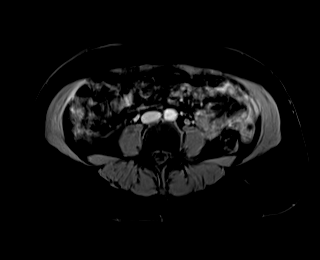

[48 of 48 positions shown; findings below may reference images not displayed]

FINDINGS: COMBINED FINDINGS FOR BOTH MR ABDOMEN AND PELVIS

Lower Chest: No acute findings.

Hepatobiliary: A 1 cm benign hemangioma is seen in the anterior
right hepatic lobe. No other liver lesions identified. Gallbladder
is unremarkable. No evidence of biliary ductal dilatation or
choledocholithiasis.

Pancreas:  No mass or inflammatory changes.

Spleen: Within normal limits in size and appearance.

Adrenals/Urinary Tract: Normal adrenal glands. Multiple tiny renal
cysts are seen bilaterally. Moderate left renal atrophy is noted as
well as bilateral renal parenchymal scarring. No evidence of renal
masses or hydronephrosis. Diffuse bladder wall thickening is again
seen, without evidence of focal mass, consistent with cystitis.

Stomach/Bowel: No evidence of obstruction, inflammatory process or
abnormal fluid collections.

Vascular/Lymphatic: No pathologically enlarged lymph nodes. No
abdominal aortic aneurysm.

Reproductive: Multiple small uterine fibroids are seen measuring up
to 1.7 cm. Adnexal regions are unremarkable.

Other:  None.

Musculoskeletal:  No suspicious bone lesions identified.
IMPRESSION: Moderate left renal atrophy and bilateral renal parenchymal
scarring. No evidence of renal mass.

Resolution of left hydronephrosis since previous study.

Stable diffuse bladder wall thickening, consistent with chronic
cystitis.

Multiple small uterine fibroids measuring up to 1.7 cm.

Small benign hepatic hemangioma.

## 2021-01-10 MED ORDER — GADOBUTROL 1 MMOL/ML IV SOLN
7.5000 mL | Freq: Once | INTRAVENOUS | Status: AC | PRN
  Administered 2021-01-10: 7.5 mL via INTRAVENOUS

## 2021-01-17 ENCOUNTER — Telehealth: Payer: Self-pay

## 2021-01-17 NOTE — Telephone Encounter (Signed)
Done.  Pt was made aware of her sched  appts for  Follow up in 6 months, labs, please order iron labs, prior to MD

## 2021-01-17 NOTE — Telephone Encounter (Signed)
Pt notified of results via [place]. Please schedule as requested and notify pt of appts.

## 2021-01-17 NOTE — Telephone Encounter (Signed)
-----   Message from Earlie Server, MD sent at 01/14/2021 10:43 PM EST ----- Please let her know that the swelling lymph nodes have resolved. Liver lesion is likely benign hemangioma.  Follow up in 6 months, labs, please order iron labs, prior to MD thanks.

## 2021-01-31 ENCOUNTER — Other Ambulatory Visit: Payer: Self-pay | Admitting: Family Medicine

## 2021-01-31 DIAGNOSIS — Z1231 Encounter for screening mammogram for malignant neoplasm of breast: Secondary | ICD-10-CM

## 2021-02-17 ENCOUNTER — Other Ambulatory Visit: Payer: Self-pay

## 2021-02-17 ENCOUNTER — Ambulatory Visit
Admission: RE | Admit: 2021-02-17 | Discharge: 2021-02-17 | Disposition: A | Discharge status: Home / Self Care | Payer: 59 | Source: Ambulatory Visit | Attending: Family Medicine | Admitting: Family Medicine

## 2021-02-17 DIAGNOSIS — Z1231 Encounter for screening mammogram for malignant neoplasm of breast: Secondary | ICD-10-CM | POA: Insufficient documentation

## 2021-02-17 IMAGING — MG MM DIGITAL SCREENING BILAT W/ TOMO AND CAD
8 series · 8 of 24 positions shown · non-contrast
Comparison: Previous exam(s).

CLINICAL DATA: Screening.

EXAM:
DIGITAL SCREENING BILATERAL MAMMOGRAM WITH TOMOSYNTHESIS AND CAD
TECHNIQUE: Bilateral screening digital craniocaudal and mediolateral oblique
mammograms were obtained. Bilateral screening digital breast
tomosynthesis was performed. The images were evaluated with
computer-aided detection.

[L CC synth-2D]
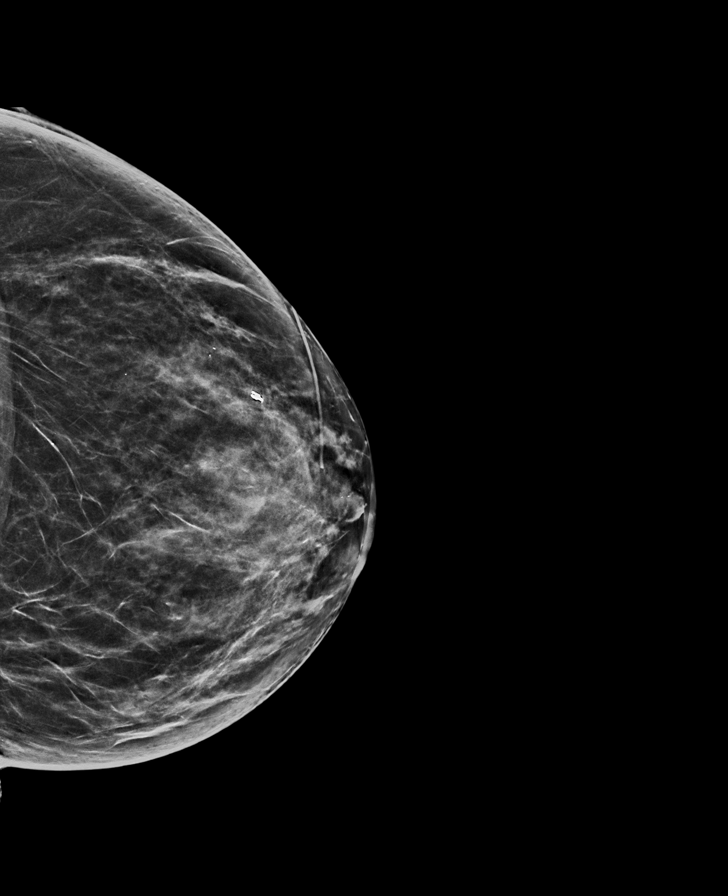

[R CC synth-2D]
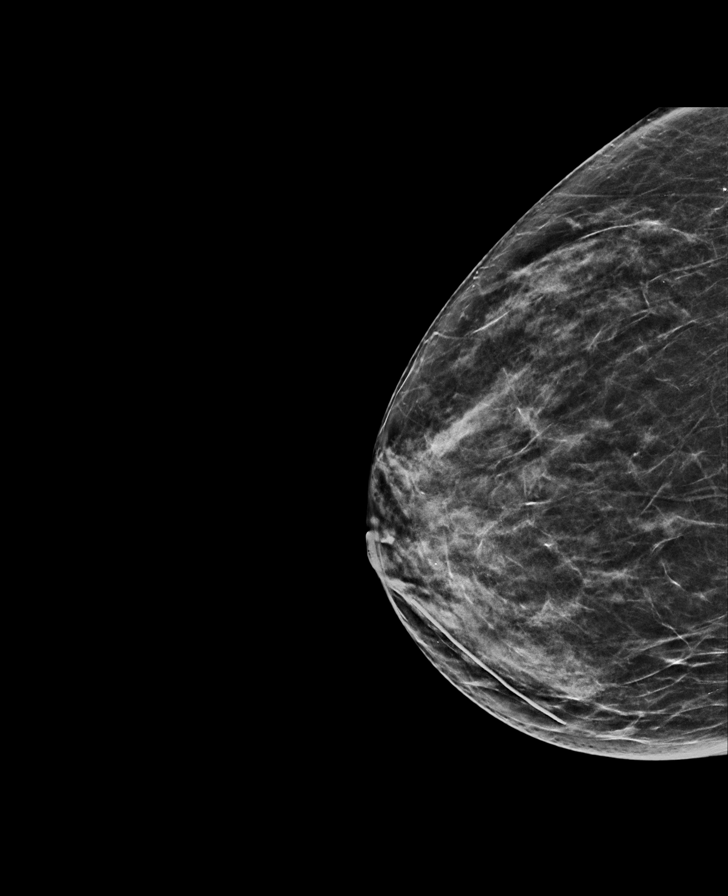

[L MLO synth-2D]
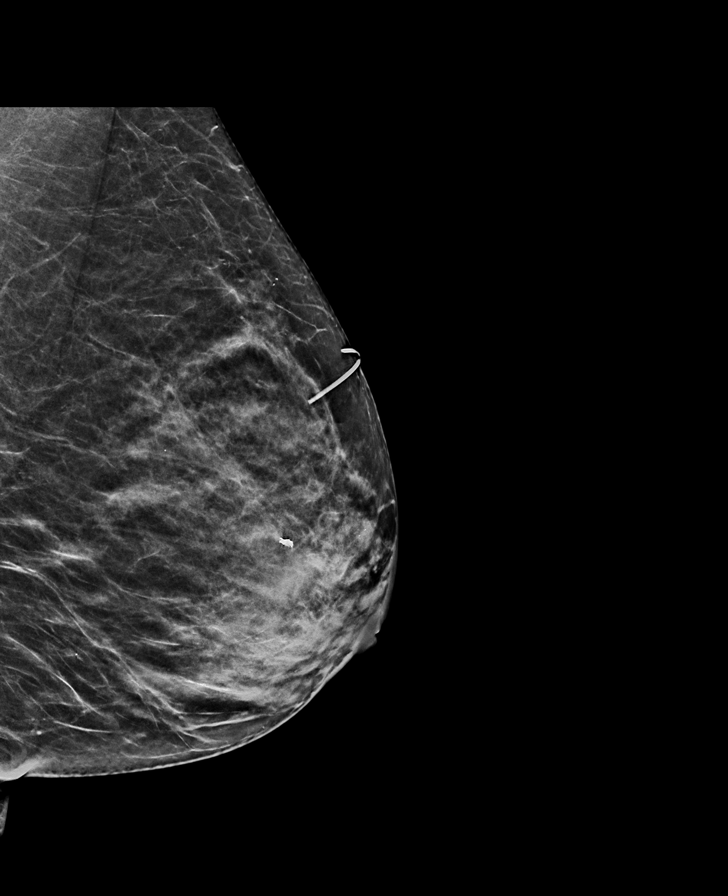

[R MLO synth-2D]
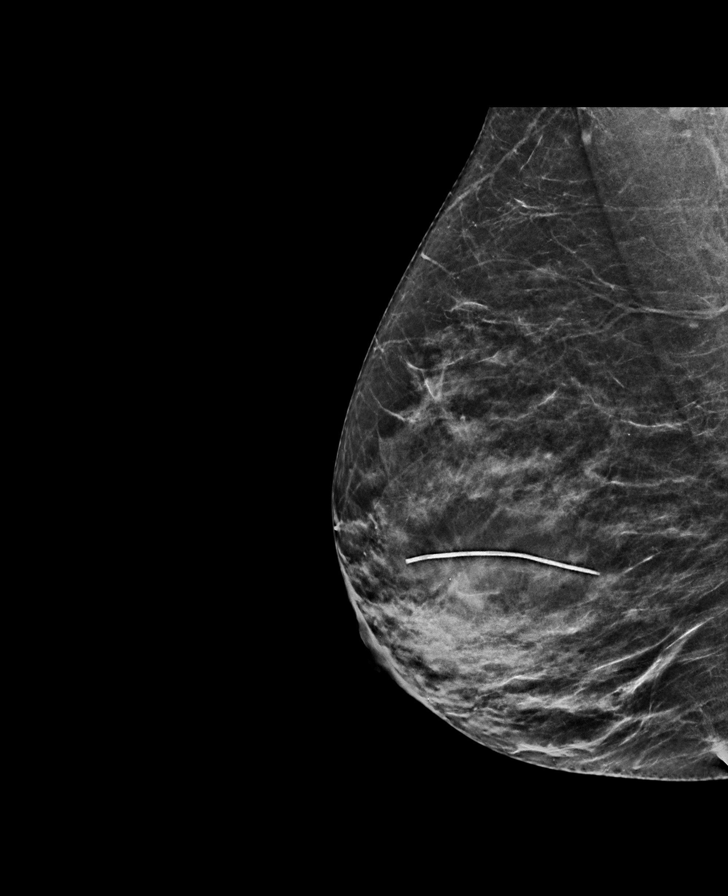

[L MLO tomo · tomo slice 29/57.0]
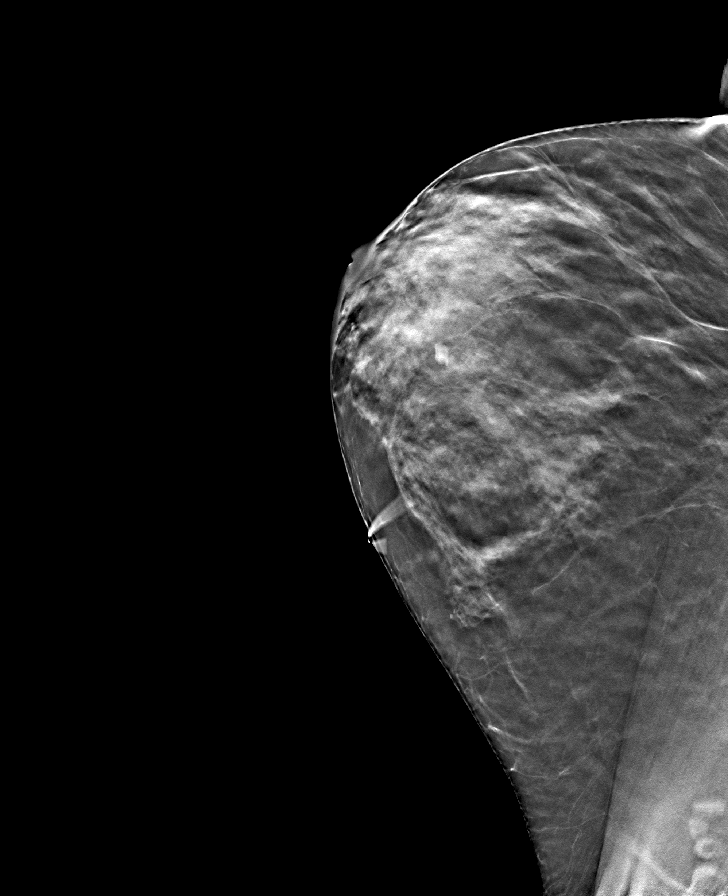

[R MLO tomo · tomo slice 30/59.0]
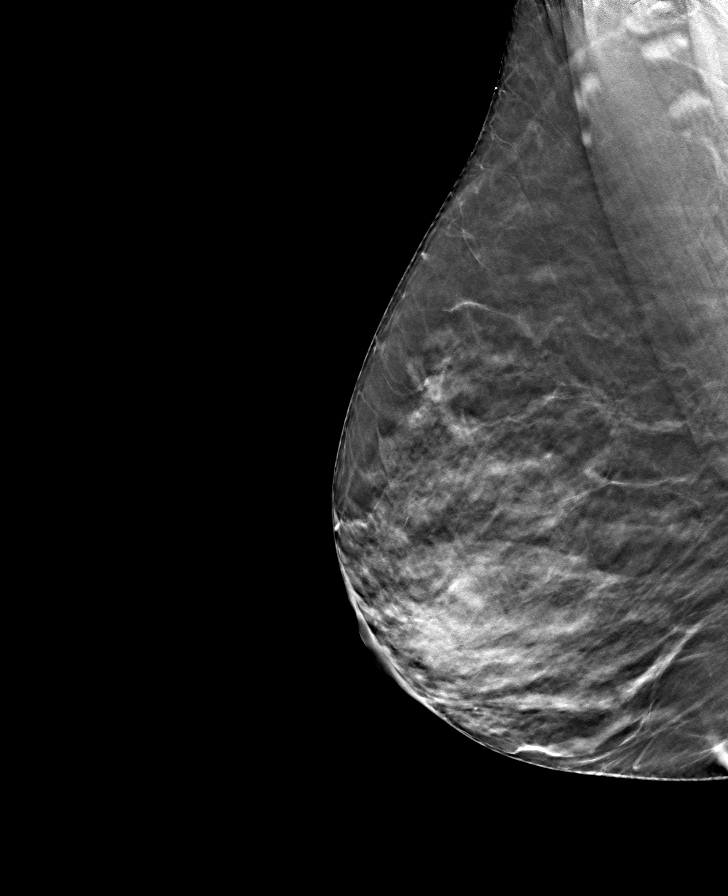

[L CC tomo · tomo slice 33/64.0]
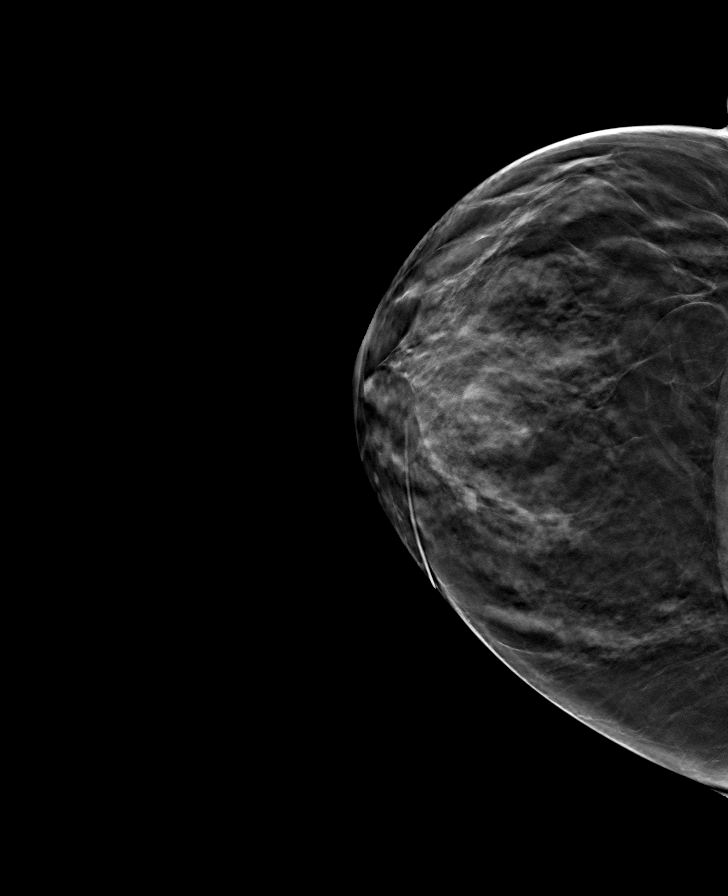

[R CC tomo · tomo slice 29/58.0]
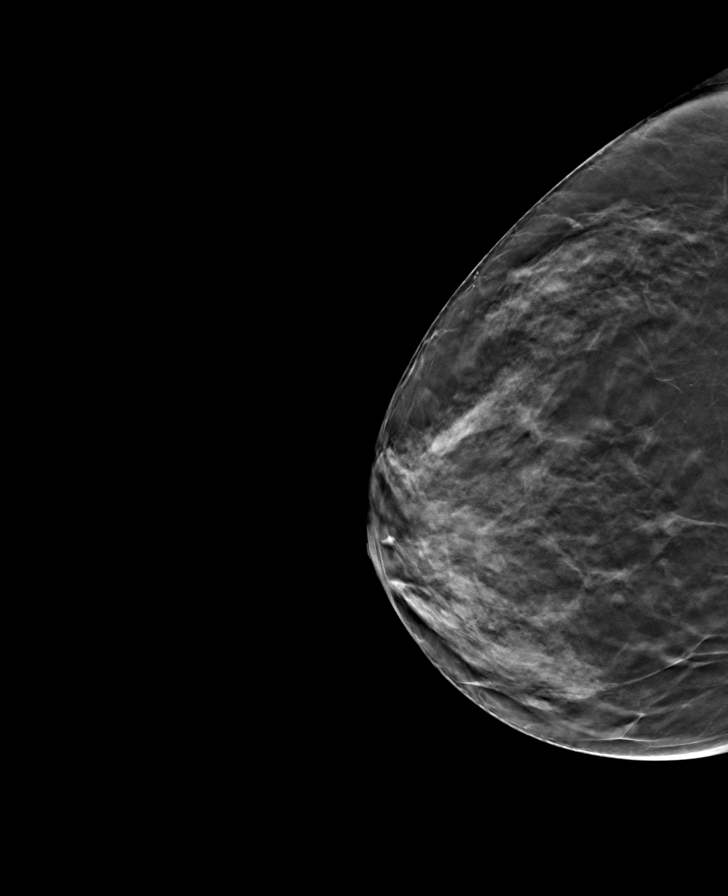

[8 of 24 positions shown; findings below may reference images not displayed]

ACR Breast Density Category c: The breast tissue is heterogeneously
dense, which may obscure small masses.
FINDINGS: There are no findings suspicious for malignancy.
IMPRESSION: No mammographic evidence of malignancy. A result letter of this
screening mammogram will be mailed directly to the patient.

RECOMMENDATION:
Screening mammogram in one year. (Code:[V2])

BI-RADS CATEGORY  1: Negative.

## 2021-02-18 ENCOUNTER — Other Ambulatory Visit: Payer: Self-pay | Admitting: *Deleted

## 2021-02-18 ENCOUNTER — Inpatient Hospital Stay
Admission: RE | Admit: 2021-02-18 | Discharge: 2021-02-18 | Disposition: A | Discharge status: Home / Self Care | Payer: Self-pay | Source: Ambulatory Visit | Attending: *Deleted | Admitting: *Deleted

## 2021-02-18 DIAGNOSIS — Z1231 Encounter for screening mammogram for malignant neoplasm of breast: Secondary | ICD-10-CM

## 2021-02-28 ENCOUNTER — Other Ambulatory Visit: Payer: Self-pay

## 2021-02-28 DIAGNOSIS — N1339 Other hydronephrosis: Secondary | ICD-10-CM

## 2021-03-01 ENCOUNTER — Other Ambulatory Visit: Payer: Self-pay

## 2021-03-01 ENCOUNTER — Other Ambulatory Visit: Payer: 59

## 2021-03-01 DIAGNOSIS — N1339 Other hydronephrosis: Secondary | ICD-10-CM

## 2021-03-02 ENCOUNTER — Ambulatory Visit (INDEPENDENT_AMBULATORY_CARE_PROVIDER_SITE_OTHER): Payer: 59 | Admitting: Urology

## 2021-03-02 ENCOUNTER — Encounter: Payer: Self-pay | Admitting: Urology

## 2021-03-02 VITALS — BP 147/75 | HR 71 | Ht 64.0 in | Wt 158.0 lb

## 2021-03-02 DIAGNOSIS — N941 Unspecified dyspareunia: Secondary | ICD-10-CM

## 2021-03-02 DIAGNOSIS — N2889 Other specified disorders of kidney and ureter: Secondary | ICD-10-CM | POA: Diagnosis not present

## 2021-03-02 DIAGNOSIS — R310 Gross hematuria: Secondary | ICD-10-CM | POA: Diagnosis not present

## 2021-03-02 DIAGNOSIS — N390 Urinary tract infection, site not specified: Secondary | ICD-10-CM

## 2021-03-02 LAB — BASIC METABOLIC PANEL
BUN/Creatinine Ratio: 20 (ref 12–28)
BUN: 64 mg/dL — ABNORMAL HIGH (ref 8–27)
CO2: 19 mmol/L — ABNORMAL LOW (ref 20–29)
Calcium: 9.9 mg/dL (ref 8.7–10.3)
Chloride: 106 mmol/L (ref 96–106)
Creatinine, Ser: 3.27 mg/dL — ABNORMAL HIGH (ref 0.57–1.00)
Glucose: 87 mg/dL (ref 65–99)
Potassium: 5.6 mmol/L — ABNORMAL HIGH (ref 3.5–5.2)
Sodium: 139 mmol/L (ref 134–144)
eGFR: 15 mL/min/{1.73_m2} — ABNORMAL LOW (ref >59)

## 2021-03-02 NOTE — Progress Notes (Signed)
   03/02/2021 12:06 PM   Alejandra Hill [birth year] [id number]  Reason for visit: Follow up left hydronephrosis, ureterocele, CKD, recurrent UTIs, gross hematuria  HPI: I saw [name] in urology clinic today for follow-up after undergoing incision of a left ureterocele. She is a 60 year old female with CKD and recurrent UTIs who was originally evaluated by Dr. Junious Silk. She was found to have moderate to severe left hydroureteronephrosis with a left ureterocele on CT on 03/10/2020. On 03/20/2019 she underwent cystoscopy,bladder biopsy,and incision of left ureterocele. Pathology of the bladder just showed inflammation and no evidence of malignancy. She denies any left-sided flank pain since surgery. Renal function had improved after surgery, but most recent kidney function worsened with creatinine 3.27, EGFR 15.  I personally viewed and interpreted her MRI on 01/10/2021 that shows no urologic abnormalities and no hydronephrosis, stable atrophic left kidney.  At our last visit, I recommended a trial of topical estrogen cream for her recurrent UTIs and dyspareunia.  She only use this for a few weeks before discontinuing the medication.  I recommended resuming this.  Urinalysis today is also grossly concerning for infection with greater than 30 WBCs, greater than 30 RBCs, moderate bacteria, nitrite negative, 3+ leukocytes.  Will send for urine culture and Ureaplasma culture and call with results for antibiotics.  Her primary urinary complaint is frequency and nocturia.  Call with culture results and start antibiotics pending those findings No evidence of obstruction on recent MRI, continue nephrology follow-up for CKD Resume topical estrogen cream for dyspareunia and recurrent UTIs  Nickolas Madrid, MD 03/02/2021

## 2021-03-04 LAB — URINALYSIS, COMPLETE
Bilirubin, UA: NEGATIVE
Glucose, UA: NEGATIVE
Ketones, UA: NEGATIVE
Nitrite, UA: NEGATIVE
Specific Gravity, UA: 1.015 (ref 1.005–1.030)
Urobilinogen, Ur: 0.2 mg/dL (ref 0.2–1.0)
pH, UA: 5.5 (ref 5.0–7.5)

## 2021-03-04 LAB — MICROSCOPIC EXAMINATION
RBC, Urine: >30 /hpf — AB (ref 0–2)
WBC, UA: >30 /hpf — AB (ref 0–5)

## 2021-03-07 ENCOUNTER — Other Ambulatory Visit: Payer: Self-pay | Admitting: Urology

## 2021-03-07 ENCOUNTER — Telehealth: Payer: Self-pay

## 2021-03-07 DIAGNOSIS — N390 Urinary tract infection, site not specified: Secondary | ICD-10-CM

## 2021-03-07 LAB — CULTURE, URINE COMPREHENSIVE

## 2021-03-07 MED ORDER — DOXYCYCLINE HYCLATE 100 MG PO CAPS: 100.0000 mg | ORAL_CAPSULE | Freq: Two times a day (BID) | ORAL | 0 refills | Status: AC

## 2021-03-07 NOTE — Telephone Encounter (Signed)
Called pt informed her of the information below. Pt gave verbal understanding. RX sent.  

## 2021-03-07 NOTE — Telephone Encounter (Signed)
-----   Message from Billey Co, MD sent at 03/07/2021  3:12 PM EDT ----- Urine culture showed UTI, lets do doxycycline 100mg  BID xc 7 days, thanks  Nickolas Madrid, MD 03/07/2021

## 2021-03-08 LAB — MYCOPLASMA / UREAPLASMA CULTURE
Mycoplasma hominis Culture: NEGATIVE
Ureaplasma urealyticum: POSITIVE — AB

## 2021-03-15 ENCOUNTER — Ambulatory Visit: Payer: BC Managed Care – PPO | Admitting: Oncology

## 2021-03-15 ENCOUNTER — Other Ambulatory Visit: Payer: BC Managed Care – PPO

## 2021-07-13 ENCOUNTER — Other Ambulatory Visit: Payer: Self-pay

## 2021-07-13 DIAGNOSIS — R591 Generalized enlarged lymph nodes: Secondary | ICD-10-CM

## 2021-07-15 ENCOUNTER — Inpatient Hospital Stay: Payer: 59 | Attending: Oncology

## 2021-07-15 DIAGNOSIS — N3281 Overactive bladder: Secondary | ICD-10-CM | POA: Insufficient documentation

## 2021-07-15 DIAGNOSIS — N3021 Other chronic cystitis with hematuria: Secondary | ICD-10-CM | POA: Insufficient documentation

## 2021-07-15 DIAGNOSIS — R591 Generalized enlarged lymph nodes: Secondary | ICD-10-CM

## 2021-07-15 DIAGNOSIS — D631 Anemia in chronic kidney disease: Secondary | ICD-10-CM | POA: Insufficient documentation

## 2021-07-15 DIAGNOSIS — N184 Chronic kidney disease, stage 4 (severe): Secondary | ICD-10-CM | POA: Insufficient documentation

## 2021-07-15 DIAGNOSIS — R634 Abnormal weight loss: Secondary | ICD-10-CM | POA: Diagnosis not present

## 2021-07-15 DIAGNOSIS — R590 Localized enlarged lymph nodes: Secondary | ICD-10-CM | POA: Insufficient documentation

## 2021-07-15 LAB — COMPREHENSIVE METABOLIC PANEL
ALT: 17 U/L (ref 0–44)
AST: 18 U/L (ref 15–41)
Albumin: 3.9 g/dL (ref 3.5–5.0)
Alkaline Phosphatase: 56 U/L (ref 38–126)
Anion gap: 10 (ref 5–15)
BUN: 70 mg/dL — ABNORMAL HIGH (ref 8–23)
CO2: 26 mmol/L (ref 22–32)
Calcium: 9.6 mg/dL (ref 8.9–10.3)
Chloride: 99 mmol/L (ref 98–111)
Creatinine, Ser: 3.99 mg/dL — ABNORMAL HIGH (ref 0.44–1.00)
GFR, Estimated: 12 mL/min — ABNORMAL LOW (ref >60)
Glucose, Bld: 97 mg/dL (ref 70–99)
Potassium: 4.3 mmol/L (ref 3.5–5.1)
Sodium: 135 mmol/L (ref 135–145)
Total Bilirubin: 0.7 mg/dL (ref 0.3–1.2)
Total Protein: 8.4 g/dL — ABNORMAL HIGH (ref 6.5–8.1)

## 2021-07-15 LAB — CBC WITH DIFFERENTIAL/PLATELET
Abs Immature Granulocytes: 0.01 10*3/uL (ref 0.00–0.07)
Basophils Absolute: 0.1 10*3/uL (ref 0.0–0.1)
Basophils Relative: 1 %
Eosinophils Absolute: 0.1 10*3/uL (ref 0.0–0.5)
Eosinophils Relative: 1 %
HCT: 32.6 % — ABNORMAL LOW (ref 36.0–46.0)
Hemoglobin: 10 g/dL — ABNORMAL LOW (ref 12.0–15.0)
Immature Granulocytes: 0 %
Lymphocytes Relative: 41 %
Lymphs Abs: 2.6 10*3/uL (ref 0.7–4.0)
MCH: 28.7 pg (ref 26.0–34.0)
MCHC: 30.7 g/dL (ref 30.0–36.0)
MCV: 93.7 fL (ref 80.0–100.0)
Monocytes Absolute: 1 10*3/uL (ref 0.1–1.0)
Monocytes Relative: 16 %
Neutro Abs: 2.6 10*3/uL (ref 1.7–7.7)
Neutrophils Relative %: 41 %
Platelets: 233 10*3/uL (ref 150–400)
RBC: 3.48 MIL/uL — ABNORMAL LOW (ref 3.87–5.11)
RDW: 13.7 % (ref 11.5–15.5)
WBC: 6.4 10*3/uL (ref 4.0–10.5)
nRBC: 0 % (ref 0.0–0.2)

## 2021-07-18 ENCOUNTER — Inpatient Hospital Stay (HOSPITAL_BASED_OUTPATIENT_CLINIC_OR_DEPARTMENT_OTHER): Payer: 59 | Admitting: Oncology

## 2021-07-18 ENCOUNTER — Encounter: Payer: Self-pay | Admitting: Oncology

## 2021-07-18 VITALS — BP 126/71 | HR 79 | Temp 98.0°F | Resp 18 | Wt 145.0 lb

## 2021-07-18 DIAGNOSIS — Z809 Family history of malignant neoplasm, unspecified: Secondary | ICD-10-CM | POA: Diagnosis not present

## 2021-07-18 DIAGNOSIS — N184 Chronic kidney disease, stage 4 (severe): Secondary | ICD-10-CM | POA: Diagnosis not present

## 2021-07-18 DIAGNOSIS — N3021 Other chronic cystitis with hematuria: Secondary | ICD-10-CM

## 2021-07-18 DIAGNOSIS — R634 Abnormal weight loss: Secondary | ICD-10-CM

## 2021-07-18 DIAGNOSIS — R590 Localized enlarged lymph nodes: Secondary | ICD-10-CM | POA: Diagnosis not present

## 2021-07-18 DIAGNOSIS — D631 Anemia in chronic kidney disease: Secondary | ICD-10-CM

## 2021-07-18 DIAGNOSIS — R591 Generalized enlarged lymph nodes: Secondary | ICD-10-CM | POA: Diagnosis not present

## 2021-07-18 MED ORDER — FERROUS SULFATE 325 (65 FE) MG PO TBEC: 325.0000 mg | DELAYED_RELEASE_TABLET | Freq: Two times a day (BID) | ORAL | 2 refills | Status: AC

## 2021-07-18 NOTE — Progress Notes (Addendum)
Hematology/Oncology Consult note [facility] Telephone:([phone] [phone]   Patient Care Team: Donnamarie Rossetti, PA-C as PCP - General (Family Medicine)  REFERRING PROVIDER: Grayland Ormond Hestle,*  CHIEF COMPLAINTS/REASON FOR VISIT:  Evaluation of lymphadenopathy  HISTORY OF PRESENTING ILLNESS:   Alejandra Hill is a  60 y.o.  female with PMH listed below was seen in consultation at the request of  Donnamarie Rossetti,*  for evaluation of lymphadenopathy Patient previously following with [facility] for most of her care.  She recently wants to move her care locally. She was seen by primary care provider [facility] recently. Extensive medical records review via care everywhere was performed by me. She has a history of recurrent UTI, chronic dysuria and urinary frequency.  She was seen by urology previously. She also had a history of retroperitoneal lymphadenopathy first was on CT scan 03/05/2018 At that time CT showed multiple enlarged retroperitoneal lymph nodes which are suspicious for malignancy such as lymphoma.  Retroperitoneal lymph nodes are amendable to percutaneous biopsy.  Nonspecific 1.2 cm right hepatic lobe lesion. 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  Lesion was incompletely evaluated on a noncontrast study.  Differential is hemangioma due to stability and precontrast T2 signal. Patient was seen by [facility] hematology oncology on 01/24/2019.  Was recommended to have a follow-up MRI July 2020.  Patient did not have image follow-up done since then.  Patient denies any unintentional weight loss, night sweats, fatigue.  Fever or chills. She is currently on a course of antibiotics for dysuria/urgency treating for UTI.  Her urine on 01/27/2020 showed large amount of blood, positive leukocyte Esterace.  Patient has chronic kidney  disease, recent creatinine was 3, estimated GFR 19.  She was last seen by her [facility] nephrology Dr. Fredirick Maudlin on 06/04/2019.  Possible need of renal biopsy was discussed at that time. Patient's work-up were positive for a low positive titer ANA and ANCA titers. SPEP negative.  Recurrent UTI, previously seen by [facility] urology 11/08/2017  02/25/2020 CT abdomen pelvis showed left hydronephrosis, and hydroureter, new from 01/10/19.  Left retroperitoneal lymph node 1.4cm, slightly bigger comparing to 1.3 on 01/10/19 Left periaortic lymph node unchanged.  Small subpleural nodule in the right middle lobe measures 4 mm  11/29/2020 Seen by Dr.Sninsky for intermittent hematuria.  cystoscopy done in 2021 which was negative. Urology plans to repeat cystoscopy if she has persistent gross hematuria. She was recommended for a trial of topical estrogen cream for her recurrent UTIs and dyspareunia.   Her sister was diagnosed with gastric cancer  INTERVAL HISTORY Alejandra Hill is a 60 y.o. female who has above history reviewed by me today presents for follow up visit for weight loss, lymphadenopathy Problems and complaints are listed below: She has lost weight since last visit, 14 pounds since earlier this year.  01/10/21 MRI abdome w/wo contrast Moderate left renal atrophy and bilateral renal parenchymal scarring. No evidence of renal mass.  Resolution of left hydronephrosis since previous study. Stable diffuse bladder wall thickening, consistent with chronic cystitis.  Multiple small uterine fibroids measuring up to 1.7 cm. Small benign hepatic hemangioma.     Review of Systems  Constitutional:  Positive for unexpected weight change. Negative for appetite change, chills, fatigue and fever.  HENT:   Negative for hearing loss and voice change.   Eyes:  Negative for eye problems.  Respiratory:  Negative  for chest tightness and cough.   Cardiovascular:  Negative for chest pain.  Gastrointestinal:  Negative for  abdominal distention, abdominal pain and blood in stool.  Endocrine: Negative for hot flashes.  Genitourinary:  Negative for difficulty urinating, dysuria and frequency.   Musculoskeletal:  Negative for arthralgias.  Skin:  Negative for itching and rash.  Neurological:  Negative for extremity weakness.  Hematological:  Negative for adenopathy.  Psychiatric/Behavioral:  Negative for confusion.    MEDICAL HISTORY:  Past Medical History:  Diagnosis Date   Allergy    Anemia    GERD (gastroesophageal reflux disease)    Overactive bladder     SURGICAL HISTORY: Past Surgical History:  Procedure Laterality Date   BREAST EXCISIONAL BIOPSY     BREAST SURGERY     fibrocystic   CYSTOSCOPY W/ RETROGRADES Left 03/19/2020   Procedure: CYSTOSCOPY WITH RETROGRADE PYELOGRAM;  Surgeon: Billey Co, MD;  Location: [facility] ORS;  Service: Urology;  Laterality: Left;   CYSTOSCOPY WITH BIOPSY N/A 03/19/2020   Procedure: CYSTOSCOPY WITH bladder BIOPSY and fulgeration;  Surgeon: Billey Co, MD;  Location: [facility] ORS;  Service: Urology;  Laterality: N/A;   CYSTOSCOPY WITH URETEROSCOPY Left 03/19/2020   Procedure: CYSTOSCOPY WITH URETEROSCOPY;  Surgeon: Billey Co, MD;  Location: [facility] ORS;  Service: Urology;  Laterality: Left;   LAPAROTOMY     ectopic    SOCIAL HISTORY: Social History   Socioeconomic History   Marital status: Single    Spouse name: Not on file   Number of children: Not on file   Years of education: Not on file   Highest education level: Not on file  Occupational History   Not on file  Tobacco Use   Smoking status: Never   Smokeless tobacco: Never  Vaping Use   Vaping Use: Never used  Substance and Sexual Activity   Alcohol use: Not Currently   Drug use: No   Sexual activity: Not Currently    Birth control/protection: None, Post-menopausal  Other Topics Concern   Not on file  Social History Narrative   Lives alone in [place]. Works as Sports coach.   Social  Determinants of Health   Financial Resource Strain: Not on file  Food Insecurity: Not on file  Transportation Needs: Not on file  Physical Activity: Not on file  Stress: Not on file  Social Connections: Not on file  Intimate Partner Violence: Not on file    FAMILY HISTORY: Family History  Problem Relation Age of Onset   Cancer Mother        pancreatic   Hypertension Mother    Cancer Father        lung   Hypertension Father    Throat cancer Father    Cancer Maternal Grandmother        bone   Cancer Paternal Grandmother        breast   Breast cancer Sister    Stomach cancer Sister     ALLERGIES:  has No Known Allergies.  MEDICATIONS:  Current Outpatient Medications  Medication Sig Dispense Refill   calcium citrate-vitamin D (CITRACAL+D) 315-200 MG-UNIT per tablet Take 1 tablet by mouth daily.      estradiol (ESTRACE) 0.1 MG/GM vaginal cream Estrogen Cream Instruction Discard applicator Apply pea sized amount to tip of finger to urethra before bed. Wash hands well after application. Use Monday, Wednesday and Friday 42.5 g 12   ferrous sulfate 325 (65 FE) MG EC tablet Take 1 tablet (325 mg total)  by mouth 2 (two) times daily with a meal. 60 tablet 2   fexofenadine (ALLEGRA) 180 MG tablet Take 180 mg by mouth daily as needed for allergies or rhinitis.     Multiple Vitamins-Minerals (ALIVE WOMENS ENERGY) TABS Take 1 tablet by mouth daily.      Specialty Vitamins Products (ONE-A-DAY CHOLESTEROL PLUS PO) Take 2 tablets by mouth in the morning and at bedtime.     Garlic-DHA-EPA (GARLIC/EPA PO) Take 1 tablet by mouth daily.  (Patient not taking: Reported on 07/18/2021)     omeprazole (PRILOSEC) 20 MG capsule Take 20 mg by mouth daily. (Patient not taking: Reported on 07/18/2021)     No current facility-administered medications for this visit.     PHYSICAL EXAMINATION: ECOG PERFORMANCE STATUS: 1 - Symptomatic but completely ambulatory Vitals:   07/18/21 1007  BP: 126/71  Pulse:  79  Resp: 18  Temp: 98 F (36.7 C)  SpO2: 100%   Filed Weights   07/18/21 1007  Weight: 145 lb (65.8 kg)    Physical Exam Constitutional:      General: She is not in acute distress. HENT:     Head: Normocephalic and atraumatic.  Eyes:     General: No scleral icterus. Cardiovascular:     Rate and Rhythm: Normal rate and regular rhythm.     Heart sounds: Normal heart sounds.  Pulmonary:     Effort: Pulmonary effort is normal. No respiratory distress.     Breath sounds: No wheezing.  Abdominal:     General: Bowel sounds are normal. There is no distension.     Palpations: Abdomen is soft.  Musculoskeletal:        General: No deformity. Normal range of motion.     Cervical back: Normal range of motion and neck supple.  Skin:    General: Skin is warm and dry.     Findings: No erythema or rash.  Neurological:     Mental Status: She is alert and oriented to person, place, and time. Mental status is at baseline.     Cranial Nerves: No cranial nerve deficit.     Coordination: Coordination normal.  Psychiatric:        Mood and Affect: Mood normal.    LABORATORY DATA:  I have reviewed the data as listed Lab Results  Component Value Date   WBC 6.4 07/15/2021   HGB 10.0 (L) 07/15/2021   HCT 32.6 (L) 07/15/2021   MCV 93.7 07/15/2021   PLT 233 07/15/2021   Recent Labs    12/29/20 1012 03/01/21 1151 07/15/21 1028  NA 137 139 135  K 5.3* 5.6* 4.3  CL 106 106 99  CO2 21* 19* 26  GLUCOSE 98 87 97  BUN 53* 64* 70*  CREATININE 2.94* 3.27* 3.99*  CALCIUM 9.1 9.9 9.6  GFRNONAA 18*  --  12*  PROT 8.1  --  8.4*  ALBUMIN 3.5  --  3.9  AST 36  --  18  ALT 40  --  17  ALKPHOS 104  --  56  BILITOT 0.5  --  0.7    Iron/TIBC/Ferritin/ %Sat    Component Value Date/Time   IRON 56 12/03/2012 1223   TIBC 356 12/03/2012 1223   FERRITIN 97 12/03/2012 1223   IRONPCTSAT 16 12/03/2012 1223      RADIOGRAPHIC STUDIES: I have personally reviewed the radiological images as  listed and agreed with the findings in the report. No results found.    ASSESSMENT & PLAN:  1. Lymphadenopathy   2. Family history of cancer   3. Anemia due to stage 4 chronic kidney disease ([place])   4. Weight loss   5. Chronic cystitis with hematuria    #Retroperitoneal lymphadenopathy.Peripheral flow cytometry negative, LDH negative. Jan 2022, Lymphadenopathy has resolved.   # Small liver lesion was stable, likely hemangioma.  # Unintentional weight loss - Jan 2022 MRI abdomen pelvis no suspicious liver mass, no stomach/bowel obstruction.inflammation, fluid collection. No lymphadenopathy.  Normal TSH, etiology unclear. Family history of gastric cancer. Refer to Dietitian.   # CKD, worseing creatinine. SPEP negative- 02/01/2021, follow up with nephrology # Anemia of chronic kidney disease.  Recommend patient take Ferrous sulfate 1-2 time daily. Check iron at next visit.  #Chronic cystitis, follow up with urology   Orders Placed This Encounter  Procedures   CBC with Differential/Platelet    Standing Status:   Standing    Number of Occurrences:   2    Standing Expiration Date:   07/18/2022   Ferritin    Standing Status:   Standing    Number of Occurrences:   2    Standing Expiration Date:   07/18/2022   Iron and TIBC    Standing Status:   Standing    Number of Occurrences:   2    Standing Expiration Date:   07/18/2022   Ambulatory referral to Genetics    Referral Priority:   Routine    Referral Type:   Consultation    Referral Reason:   Specialty Services Required    Referred to Provider:   Faith Rogue T    Number of Visits Requested:   1    All questions were answered. The patient knows to call the clinic with any problems questions or concerns.  cc Venetia Maxon, Rolanda Jay,*    Return of visit:  Lab NP 4 month - cbc, iron tibc ferritin Lab MD 8 months -cbc, iron tibc ferritin   Earlie Server, MD, PhD Hematology Oncology [facility] Pager- [phone] 07/18/2021

## 2021-07-18 NOTE — Progress Notes (Signed)
No new concerns. States that she has not had to start dialysis, but will beplaced on kidney transplant list.

## 2021-07-28 ENCOUNTER — Inpatient Hospital Stay: Payer: 59

## 2021-07-28 ENCOUNTER — Inpatient Hospital Stay: Payer: 59 | Attending: Oncology | Admitting: Licensed Clinical Social Worker

## 2021-07-28 ENCOUNTER — Encounter: Payer: Self-pay | Admitting: Licensed Clinical Social Worker

## 2021-07-28 DIAGNOSIS — Z800 Family history of malignant neoplasm of digestive organs: Secondary | ICD-10-CM | POA: Diagnosis not present

## 2021-07-28 DIAGNOSIS — Z803 Family history of malignant neoplasm of breast: Secondary | ICD-10-CM

## 2021-07-28 DIAGNOSIS — Z801 Family history of malignant neoplasm of trachea, bronchus and lung: Secondary | ICD-10-CM | POA: Diagnosis not present

## 2021-07-28 NOTE — Progress Notes (Signed)
REFERRING PROVIDER: Rickard Patience, MD [address] [place],  [place] [address]  PRIMARY PROVIDER:  Debbra Riding Hestle, PA-C  PRIMARY REASON FOR VISIT:  1. Family history of breast cancer   2. Family history of pancreatic cancer   3. Family history of cancer of gallbladder   4. Family history of lung cancer      HISTORY OF PRESENT ILLNESS:   Ms. [place], a 60 y.o. female, was seen for a [facility] cancer genetics consultation at the request of Dr. Cathie Hoops due to a family history of cancer.  Ms. [place] presents to clinic today to discuss the possibility of a hereditary predisposition to cancer, genetic testing, and to further clarify her future cancer risks, as well as potential cancer risks for family members.   Ms. [place] is a 60 y.o. female with no personal history of cancer.  She had retroperitoneal lymphadenopathy first seen in 2019 which has since resolved.  She also has chronic kidney disease, stage 4.   CANCER HISTORY:  Oncology History   No history exists.     RISK FACTORS:  Menarche was at age 58.  OCP use: yes Ovaries intact: yes.  Hysterectomy: no.  Menopausal status: postmenopausal.  HRT use: 0 years. Colonoscopy: yes; normal. Mammogram within the last year: yes. Number of breast biopsies: had tumors removed from breast at 13.  Up to date with pelvic exams: yes.   Past Medical History:  Diagnosis Date   Allergy    Anemia    Family history of breast cancer    Family history of cancer of gallbladder    Family history of lung cancer    Family history of pancreatic cancer    GERD (gastroesophageal reflux disease)    Overactive bladder     Past Surgical History:  Procedure Laterality Date   BREAST EXCISIONAL BIOPSY     BREAST SURGERY     fibrocystic   CYSTOSCOPY W/ RETROGRADES Left 03/19/2020   Procedure: CYSTOSCOPY WITH RETROGRADE PYELOGRAM;  Surgeon: Sondra Come, MD;  Location: [facility];  Service: Urology;  Laterality: Left;   CYSTOSCOPY WITH  BIOPSY N/A 03/19/2020   Procedure: CYSTOSCOPY WITH bladder BIOPSY and fulgeration;  Surgeon: Sondra Come, MD;  Location: [facility] OR[facility];  Service: Urology;  Laterality: N/A;   CYSTOSCOPY WITH URETEROSCOPY Left 03/19/2020   Procedure: CYSTOSCOPY WITH URETEROSCOPY;  Surgeon: Sondra Come, MD;  Location: [facility];  Service: Urology;  Laterality: Left;   LAPAROTOMY     ectopic    Social History   Socioeconomic History   Marital status: Single    Spouse name: Not on file   Number of children: Not on file   Years of education: Not on file   Highest education level: Not on file  Occupational History   Not on file  Tobacco Use   Smoking status: Never   Smokeless tobacco: Never  Vaping Use   Vaping Use: Never used  Substance and Sexual Activity   Alcohol use: Not Currently   Drug use: No   Sexual activity: Not Currently    Birth control/protection: None, Post-menopausal  Other Topics Concern   Not on file  Social History Narrative   Lives alone in [place]. Works as Arboriculturist.   Social Determinants of Health   Financial Resource Strain: Not on file  Food Insecurity: Not on file  Transportation Needs: Not on file  Physical Activity: Not on file  Stress: Not on file  Social Connections: Not on file  FAMILY HISTORY:  We obtained a detailed, 4-generation family history.  Significant diagnoses are listed below: Family History  Problem Relation Age of Onset   Pancreatic cancer Mother 14   Hypertension Mother    Lung cancer Father 43       hx smoking   Hypertension Father    Breast cancer Sister 42   Other Sister 56       gallbladder cancer   Bone cancer Maternal Grandmother 65   Breast cancer Paternal Grandmother 27   Ms. [place] has 2 brothers, 2 sisters. One sister had breast cancer at 66. Her other sister is currently being treated for gallbladder cancer.   Ms. Gatti mother had pancreatic cancer at 56 and died at 65. Patient had 3 maternal uncles, no cancers.  Maternal grandmother had breast cancer and died at 39. No information about maternal grandfather.  Ms. Syverson father had lung cancer and died at 67, he had history of smoking. Patient had 2 paternal uncles, 1 aunt, no cancers. Maternal grandmother had bone cancer and died at 5. No information about paternal grandfather.   Ms. [place] is unaware of previous family history of genetic testing for hereditary cancer risks. There is no reported Ashkenazi Jewish ancestry. There is no known consanguinity.   GENETIC COUNSELING ASSESSMENT: Ms. [place] is a 60 y.o. female with a family history of cancer which is somewhat suggestive of a hereditary cancer syndrome and predisposition to cancer. We, therefore, discussed and recommended the following at today's visit.   DISCUSSION: We discussed that approximately 5-10% of pancreatic/breast cancer is hereditary. Most cases of hereditary breast and pancreatic cancer are associated with BRCA1/BRCA2 genes, although there are other genes associated with hereditary cancer as well. Cancers and risks are gene specific.  We discussed that testing is beneficial for several reasons including knowing about other cancer risks, identifying potential screening and risk-reduction options that may be appropriate, and to understand if other family members could be at risk for cancer and allow them to undergo genetic testing.   We reviewed the characteristics, features and inheritance patterns of hereditary cancer syndromes. We also discussed genetic testing, including the appropriate family members to test, the process of testing, insurance coverage and turn-around-time for results. We discussed the implications of a negative, positive and/or variant of uncertain significant result. We recommended Ms. [place] pursue genetic testing for the [employer] CustomNext+RNA gene panel.   The CustomNext-Cancer + RNAinsight panel  includes sequencing and/or deletion duplication testing of the  following 47 genes: APC, ATM, AXIN2, BARD1, BMPR1A, BRCA1, BRCA2, BRIP1, CDH1, CDKN2A (p14ARF), CDKN2A (p16INK4a), CKD4, CHEK2, CTNNA1, DICER1, EPCAM (Deletion/duplication testing only), GREM1 (promoter region deletion/duplication testing only), KIT, MEN1, MLH1, MSH2, MSH3, MSH6, MUTYH, NBN, NF1, NHTL1, PALB2, PDGFRA, PMS2, POLD1, POLE, PTEN, RAD50, RAD51C, RAD51D, SDHB, SDHC, SDHD, SMAD4, SMARCA4. STK11, TP53, TSC1, TSC2, and VHL.  The following genes were evaluated for sequence changes only: SDHA and HOXB13 c.251G>A variant only.  Based on Ms. Kenna's family history of cancer, she meets medical criteria for genetic testing. Despite that she meets criteria, she may still have an out of pocket cost. We discussed that if her out of pocket cost for testing is over $100, the laboratory will call and confirm whether she wants to proceed with testing.  If the out of pocket cost of testing is less than $100 she will be billed by the genetic testing laboratory.   PLAN: After considering the risks, benefits, and limitations, Ms. [place] provided informed consent to pursue genetic  testing and the blood sample was sent to [facility] for analysis of the CustomNext+RNA Panel. Results should be available within approximately 2-3 weeks' time, at which point they will be disclosed by telephone to Ms. [place], as will any additional recommendations warranted by these results. Ms. [place] will receive a summary of her genetic counseling visit and a copy of her results once available. This information will also be available in Epic.   Ms. Ludwig questions were answered to her satisfaction today. Our contact information was provided should additional questions or concerns arise. Thank you for the referral and allowing [place] to share in the care of your patient.   Faith Rogue, MS, [facility] Genetic Counselor [email or web address] Phone: [phone]  The patient was seen for a total of 20 minutes in  face-to-face genetic counseling.  Patient was seen alone.  Dr. Grayland Ormond was available for discussion regarding this case.   _______________________________________________________________________ For Office Staff:  Number of people involved in session: 1 Was an Intern/ student involved with case: no

## 2021-08-03 DIAGNOSIS — N2581 Secondary hyperparathyroidism of renal origin: Secondary | ICD-10-CM | POA: Insufficient documentation

## 2021-08-10 ENCOUNTER — Encounter: Payer: Self-pay | Admitting: Licensed Clinical Social Worker

## 2021-08-10 ENCOUNTER — Telehealth: Payer: Self-pay | Admitting: Licensed Clinical Social Worker

## 2021-08-10 ENCOUNTER — Ambulatory Visit: Payer: Self-pay | Admitting: Licensed Clinical Social Worker

## 2021-08-10 DIAGNOSIS — Z800 Family history of malignant neoplasm of digestive organs: Secondary | ICD-10-CM

## 2021-08-10 DIAGNOSIS — Z801 Family history of malignant neoplasm of trachea, bronchus and lung: Secondary | ICD-10-CM

## 2021-08-10 DIAGNOSIS — Z803 Family history of malignant neoplasm of breast: Secondary | ICD-10-CM

## 2021-08-10 DIAGNOSIS — Z1379 Encounter for other screening for genetic and chromosomal anomalies: Secondary | ICD-10-CM | POA: Insufficient documentation

## 2021-08-10 NOTE — Telephone Encounter (Signed)
Revealed negative genetic testing. This normal result is reassuring and indicates that it is unlikely Alejandra Hill's cancer is due to a hereditary cause.  It is unlikely that there is an increased risk of another cancer due to a mutation in one of these genes.  However, genetic testing is not perfect, and cannot definitively rule out a hereditary cause.  It will be important for her to keep in contact with genetics to learn if any additional testing may be needed in the future.

## 2021-08-10 NOTE — Progress Notes (Signed)
HPI:  [name] was previously seen in the [facility] clinic due to a family history of cancer and concerns regarding a hereditary predisposition to cancer. Please refer to our prior cancer genetics clinic note for more information regarding our discussion, assessment and recommendations, at the time. [name] recent genetic test results were disclosed to her, as were recommendations warranted by these results. These results and recommendations are discussed in more detail below.  CANCER HISTORY:  Oncology History   No history exists.    FAMILY HISTORY:  We obtained a detailed, 4-generation family history.  Significant diagnoses are listed below: Family History  Problem Relation Age of Onset   Pancreatic cancer Mother 41   Hypertension Mother    Lung cancer Father 55       hx smoking   Hypertension Father    Breast cancer Sister 36   Other Sister 53       gallbladder cancer   Bone cancer Maternal Grandmother 78   Breast cancer Paternal Grandmother 80   [name] has 2 brothers, 2 sisters. One sister had breast cancer at 68. Her other sister is currently being treated for gallbladder cancer.    [name] mother had pancreatic cancer at 52 and died at 78. Patient had 3 maternal uncles, no cancers. Maternal grandmother had breast cancer and died at 66. No information about maternal grandfather.   [name] father had lung cancer and died at 74, he had history of smoking. Patient had 2 paternal uncles, 1 aunt, no cancers. Maternal grandmother had bone cancer and died at 2. No information about paternal grandfather.    [name] is unaware of previous family history of genetic testing for hereditary cancer risks. There is no reported Ashkenazi Jewish ancestry. There is no known consanguinity.      GENETIC TEST RESULTS: Genetic testing reported out on 08/09/2021 through the [facility] CustomNext+RNA cancer panel found no pathogenic mutations.   The  CancerNext-Expanded + RNAinsight gene panel offered by [employer] and includes sequencing and rearrangement analysis for the following 47 genes:  APC, ATM, AXIN2, BARD1, BMPR1A, BRCA1, BRCA2, BRIP1, CDH1, CDKN2A (p14ARF), CDKN2A (p16INK4a), CKD4, CHEK2, CTNNA1, DICER1, EPCAM (Deletion/duplication testing only), GREM1 (promoter region deletion/duplication testing only), KIT, MEN1, MLH1, MSH2, MSH3, MSH6, MUTYH, NBN, NF1, NHTL1, PALB2, PDGFRA, PMS2, POLD1, POLE, PTEN, RAD50, RAD51C, RAD51D, SDHB, SDHC, SDHD, SMAD4, SMARCA4. STK11, TP53, TSC1, TSC2, and VHL.  The following genes were evaluated for sequence changes only: SDHA and HOXB13 c.251G>A variant only.  The test report has been scanned into EPIC and is located under the Molecular Pathology section of the Results Review tab.  A portion of the result report is included below for reference.     We discussed that because current genetic testing is not perfect, it is possible there may be a gene mutation in one of these genes that current testing cannot detect, but that chance is small.  There could be another gene that has not yet been discovered, or that we have not yet tested, that is responsible for the cancer diagnoses in the family. It is also possible there is a hereditary cause for the cancer in the family that [name] did not inherit and therefore was not identified in her testing.  Therefore, it is important to remain in touch with cancer genetics in the future so that we can continue to offer [name] the most up to date genetic testing.   ADDITIONAL GENETIC TESTING: We discussed  with [name] that her genetic testing was fairly extensive.  If there are genes identified to increase cancer risk that can be analyzed in the future, we would be happy to discuss and coordinate this testing at that time.    CANCER SCREENING RECOMMENDATIONS: [name] test result is considered negative (normal).  This means that we have not identified a  hereditary cause for her family history of cancer at this time.   While reassuring, this does not definitively rule out a hereditary predisposition to cancer. It is still possible that there could be genetic mutations that are undetectable by current technology. There could be genetic mutations in genes that have not been tested or identified to increase cancer risk.  Therefore, it is recommended she continue to follow the cancer management and screening guidelines provided by her primary healthcare provider.   An individual's cancer risk and medical management are not determined by genetic test results alone. Overall cancer risk assessment incorporates additional factors, including personal medical history, family history, and any available genetic information that may result in a personalized plan for cancer prevention and surveillance.  Based on [name]'s personal and family history of cancer as well as her genetic test results, risk model Harriett Rush was used to estimate her risk of developing breast cancer. This estimates her lifetime risk of developing breast cancer to be approximately 17%.  The patient's lifetime breast cancer risk is a preliminary estimate based on available information using one of several models endorsed by the [place] (ACS). The ACS recommends consideration of breast MRI screening as an adjunct to mammography for patients at high risk (defined as 20% or greater lifetime risk).  This risk estimate can change over time and may be repeated to reflect new information in her personal or family history in the future.     RECOMMENDATIONS FOR FAMILY MEMBERS:  Relatives in this family might be at some increased risk of developing cancer, over the general population risk, simply due to the family history of cancer.  We recommended female relatives in this family have a yearly mammogram beginning at age 38, or 51 years younger than the earliest onset of cancer, an  annual clinical breast exam, and perform monthly breast self-exams. Female relatives in this family should also have a gynecological exam as recommended by their primary provider.  All family members should be referred for colonoscopy starting at age 42.    It is also possible there is a hereditary cause for the cancer in Ms. Hagans's family that she did not inherit and therefore was not identified in her.  Based on Ms. Peeters's family history, we recommended her siblings/maternal relatives have genetic counseling and testing. [name] will let us know if we can be of any assistance in coordinating genetic counseling and/or testing for these family members.  FOLLOW-UP: Lastly, we discussed with [name] that cancer genetics is a rapidly advancing field and it is possible that new genetic tests will be appropriate for her and/or her family members in the future. We encouraged her to remain in contact with cancer genetics on an annual basis so we can update her personal and family histories and let her know of advances in cancer genetics that may benefit this family.   Our contact number was provided. Ms. Dunton questions were answered to her satisfaction, and she knows she is welcome to call us at anytime with additional questions or concerns.   Faith Rogue, MS, [facility] Genetic Counselor [email or web address]  Phone: [phone]

## 2021-10-05 ENCOUNTER — Other Ambulatory Visit (INDEPENDENT_AMBULATORY_CARE_PROVIDER_SITE_OTHER): Payer: Self-pay | Admitting: Nurse Practitioner

## 2021-10-05 ENCOUNTER — Other Ambulatory Visit (INDEPENDENT_AMBULATORY_CARE_PROVIDER_SITE_OTHER): Payer: Self-pay | Admitting: Nephrology

## 2021-10-05 DIAGNOSIS — N186 End stage renal disease: Secondary | ICD-10-CM

## 2021-10-06 ENCOUNTER — Other Ambulatory Visit: Payer: Self-pay

## 2021-10-06 ENCOUNTER — Ambulatory Visit (INDEPENDENT_AMBULATORY_CARE_PROVIDER_SITE_OTHER): Payer: 59

## 2021-10-06 ENCOUNTER — Telehealth: Payer: Self-pay

## 2021-10-06 ENCOUNTER — Ambulatory Visit (INDEPENDENT_AMBULATORY_CARE_PROVIDER_SITE_OTHER): Payer: 59 | Admitting: Nurse Practitioner

## 2021-10-06 ENCOUNTER — Encounter (INDEPENDENT_AMBULATORY_CARE_PROVIDER_SITE_OTHER): Payer: Self-pay | Admitting: Nurse Practitioner

## 2021-10-06 VITALS — BP 128/69 | HR 67 | Ht 64.0 in | Wt 147.0 lb

## 2021-10-06 DIAGNOSIS — D631 Anemia in chronic kidney disease: Secondary | ICD-10-CM

## 2021-10-06 DIAGNOSIS — N186 End stage renal disease: Secondary | ICD-10-CM

## 2021-10-06 DIAGNOSIS — N185 Chronic kidney disease, stage 5: Secondary | ICD-10-CM | POA: Diagnosis not present

## 2021-10-06 NOTE — Progress Notes (Signed)
Subjective:    Patient ID: Alejandra Hill, female    DOB: 02-20-1961, 60 y.o.   MRN: [id number] Chief Complaint  Patient presents with   New Patient (Initial Visit)    NP- V-Mapping  UE art& consult . CKD 5. Referred by Kolloru Sarath     Alejandra Hill is a 60 year old female that is seen for evaluation for dialysis access. The patient has chronic renal insufficiency stage V secondary to acute kidney injury with limited recovery. The patient's most recent creatinine clearance is less than 20. The patient volume status has not yet become an issue. Patient's blood pressures been relatively well controlled.  The patient is right-handed.  The patient has been considering the various methods of dialysis and wishes to proceed with hemodialysis and therefore creation of AV access.  The patient denies amaurosis fugax or recent TIA symptoms. There are no recent neurological changes noted. The patient denies claudication symptoms or rest pain symptoms. The patient denies history of DVT, PE or superficial thrombophlebitis. The patient denies recent episodes of angina or shortness of breath.    Today the patient has adequate vein diameters for creation of a left brachiocephalic AV fistula   Review of Systems  Respiratory:  Negative for shortness of breath.   Cardiovascular:  Negative for leg swelling.  All other systems reviewed and are negative.     Objective:   Physical Exam Vitals reviewed.  HENT:     Head: Normocephalic.  Cardiovascular:     Rate and Rhythm: Normal rate.     Pulses:          Radial pulses are 2+ on the right side and 2+ on the left side.  Pulmonary:     Effort: Pulmonary effort is normal.  Skin:    General: Skin is warm and dry.  Neurological:     Mental Status: She is alert and oriented to person, place, and time.  Psychiatric:        Mood and Affect: Mood normal.        Behavior: Behavior normal.        Thought Content: Thought content normal.         Judgment: Judgment normal.    BP 128/69   Pulse 67   Ht 5\' 4"  (1.626 m)   Wt 147 lb (66.7 kg)   LMP 10/14/2011   BMI 25.23 kg/m   Past Medical History:  Diagnosis Date   Allergy    Anemia    Family history of breast cancer    Family history of cancer of gallbladder    Family history of lung cancer    Family history of pancreatic cancer    GERD (gastroesophageal reflux disease)    Overactive bladder     Social History   Socioeconomic History   Marital status: Single    Spouse name: Not on file   Number of children: Not on file   Years of education: Not on file   Highest education level: Not on file  Occupational History   Not on file  Tobacco Use   Smoking status: Never   Smokeless tobacco: Never  Vaping Use   Vaping Use: Never used  Substance and Sexual Activity   Alcohol use: Not Currently   Drug use: No   Sexual activity: Not Currently    Birth control/protection: None, Post-menopausal  Other Topics Concern   Not on file  Social History Narrative   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  Social Determinants of Health   Financial Resource Strain: Not on file  Food Insecurity: Not on file  Transportation Needs: Not on file  Physical Activity: Not on file  Stress: Not on file  Social Connections: Not on file  Intimate Partner Violence: Not on file    Past Surgical History:  Procedure Laterality Date   BREAST EXCISIONAL BIOPSY     BREAST SURGERY     fibrocystic   CYSTOSCOPY W/ RETROGRADES Left 03/19/2020   Procedure: CYSTOSCOPY WITH RETROGRADE PYELOGRAM;  Surgeon: Billey Co, MD;  Location: [facility] OR[facility];  Service: Urology;  Laterality: Left;   CYSTOSCOPY WITH BIOPSY N/A 03/19/2020   Procedure: CYSTOSCOPY WITH bladder BIOPSY and fulgeration;  Surgeon: Billey Co, MD;  Location: [facility] ORS;  Service: Urology;  Laterality: N/A;   CYSTOSCOPY WITH URETEROSCOPY Left 03/19/2020   Procedure: CYSTOSCOPY WITH URETEROSCOPY;  Surgeon: Billey Co, MD;  Location: [facility] ORS;  Service: Urology;  Laterality: Left;   LAPAROTOMY     ectopic    Family History  Problem Relation Age of Onset   Pancreatic cancer Mother 43   Hypertension Mother    Lung cancer Father 24       hx smoking   Hypertension Father    Breast cancer Sister 21   Other Sister 86       gallbladder cancer   Bone cancer Maternal Grandmother 50   Breast cancer Paternal Grandmother 2    No Known Allergies  CBC Latest Ref Rng & Units 07/15/2021 12/29/2020 02/02/2020  WBC 4.0 - 10.5 K/uL 6.4 8.2 7.2  Hemoglobin 12.0 - 15.0 g/dL 10.0(L) 9.3(L) 9.4(L)  Hematocrit 36.0 - 46.0 % 32.6(L) 29.9(L) 31.8(L)  Platelets 150 - 400 K/uL 233 299 308      CMP     Component Value Date/Time   NA 135 07/15/2021 1028   NA 139 03/01/2021 1151   K 4.3 07/15/2021 1028   CL 99 07/15/2021 1028   CO2 26 07/15/2021 1028   GLUCOSE 97 07/15/2021 1028   BUN 70 (H) 07/15/2021 1028   BUN 64 (H) 03/01/2021 1151   CREATININE 3.99 (H) 07/15/2021 1028   CREATININE 1.31 (H) 10/13/2014 1007   CALCIUM 9.6 07/15/2021 1028   PROT 8.4 (H) 07/15/2021 1028   ALBUMIN 3.9 07/15/2021 1028   AST 18 07/15/2021 1028   ALT 17 07/15/2021 1028   ALKPHOS 56 07/15/2021 1028   BILITOT 0.7 07/15/2021 1028   GFRNONAA 12 (L) 07/15/2021 1028     No results found.     Assessment & Plan:   1. Chronic kidney disease, stage V ([place]) Recommend:  At this time the patient does not have appropriate extremity access for dialysis  Patient should have a Left brachial cephalic fistula created.  The risks, benefits and alternative therapies were reviewed in detail with the patient.  All questions were answered.  The patient agrees to proceed with surgery.     Current Outpatient Medications on File Prior to Visit  Medication Sig Dispense Refill   calcium citrate-vitamin D (CITRACAL+D) 315-200 MG-UNIT per tablet Take 1 tablet by mouth daily.      estradiol (ESTRACE) 0.1 MG/GM vaginal cream Estrogen Cream  Instruction Discard applicator Apply pea sized amount to tip of finger to urethra before bed. Wash hands well after application. Use Monday, Wednesday and Friday 42.5 g 12   ferrous sulfate 325 (65 FE) MG EC tablet Take 1 tablet (325 mg total) by mouth 2 (two) times daily with  a meal. 60 tablet 2   fexofenadine (ALLEGRA) 180 MG tablet Take 180 mg by mouth daily as needed for allergies or rhinitis.     Garlic-DHA-EPA (GARLIC/EPA PO) Take 1 tablet by mouth daily.     Multiple Vitamins-Minerals (ALIVE WOMENS ENERGY) TABS Take 1 tablet by mouth daily.      omeprazole (PRILOSEC) 20 MG capsule Take 20 mg by mouth daily.     Specialty Vitamins Products (ONE-A-DAY CHOLESTEROL PLUS PO) Take 2 tablets by mouth in the morning and at bedtime.     Pseudoephedrine-Guaifenesin [phone] MG TB12 Take 1 tablet by mouth daily.     No current facility-administered medications on file prior to visit.    There are no Patient Instructions on file for this visit. No follow-ups on file.   Kris Hartmann, NP

## 2021-10-06 NOTE — Telephone Encounter (Signed)
Per Melissa R, there is a referral for anemia on this pt. She is an established pt and will be seeing Sonia Baller in Nov. Per Dr. Tasia Catchings, move appts to next week :lab/NP with labs 1-2 days prior (cbc,cmp, tech smear, retic panel, folate, b12). Please contact pt with updated appt.

## 2021-10-10 ENCOUNTER — Other Ambulatory Visit: Payer: Self-pay

## 2021-10-10 ENCOUNTER — Inpatient Hospital Stay: Payer: 59 | Attending: Oncology

## 2021-10-10 DIAGNOSIS — D509 Iron deficiency anemia, unspecified: Secondary | ICD-10-CM | POA: Diagnosis not present

## 2021-10-10 DIAGNOSIS — D631 Anemia in chronic kidney disease: Secondary | ICD-10-CM | POA: Diagnosis not present

## 2021-10-10 DIAGNOSIS — N184 Chronic kidney disease, stage 4 (severe): Secondary | ICD-10-CM | POA: Diagnosis not present

## 2021-10-10 DIAGNOSIS — R591 Generalized enlarged lymph nodes: Secondary | ICD-10-CM | POA: Insufficient documentation

## 2021-10-10 LAB — RETIC PANEL
Immature Retic Fract: 10.1 % (ref 2.3–15.9)
RBC.: 3.22 MIL/uL — ABNORMAL LOW (ref 3.87–5.11)
Retic Count, Absolute: 52.8 10*3/uL (ref 19.0–186.0)
Retic Ct Pct: 1.6 % (ref 0.4–3.1)
Reticulocyte Hemoglobin: 31.2 pg (ref >27.9)

## 2021-10-10 LAB — CBC WITH DIFFERENTIAL/PLATELET
Abs Immature Granulocytes: 0.02 10*3/uL (ref 0.00–0.07)
Basophils Absolute: 0.1 10*3/uL (ref 0.0–0.1)
Basophils Relative: 1 %
Eosinophils Absolute: 0.1 10*3/uL (ref 0.0–0.5)
Eosinophils Relative: 1 %
HCT: 29.4 % — ABNORMAL LOW (ref 36.0–46.0)
Hemoglobin: 9 g/dL — ABNORMAL LOW (ref 12.0–15.0)
Immature Granulocytes: 0 %
Lymphocytes Relative: 26 %
Lymphs Abs: 2 10*3/uL (ref 0.7–4.0)
MCH: 28.3 pg (ref 26.0–34.0)
MCHC: 30.6 g/dL (ref 30.0–36.0)
MCV: 92.5 fL (ref 80.0–100.0)
Monocytes Absolute: 0.8 10*3/uL (ref 0.1–1.0)
Monocytes Relative: 11 %
Neutro Abs: 4.6 10*3/uL (ref 1.7–7.7)
Neutrophils Relative %: 61 %
Platelets: 326 10*3/uL (ref 150–400)
RBC: 3.18 MIL/uL — ABNORMAL LOW (ref 3.87–5.11)
RDW: 14.2 % (ref 11.5–15.5)
WBC: 7.6 10*3/uL (ref 4.0–10.5)
nRBC: 0 % (ref 0.0–0.2)

## 2021-10-10 LAB — IRON AND TIBC
Iron: 21 ug/dL — ABNORMAL LOW (ref 28–170)
Saturation Ratios: 9 % — ABNORMAL LOW (ref 10.4–31.8)
TIBC: 230 ug/dL — ABNORMAL LOW (ref 250–450)
UIBC: 209 ug/dL

## 2021-10-10 LAB — TECHNOLOGIST SMEAR REVIEW: Plt Morphology: ADEQUATE

## 2021-10-10 LAB — COMPREHENSIVE METABOLIC PANEL
ALT: 18 U/L (ref 0–44)
AST: 15 U/L (ref 15–41)
Albumin: 3.5 g/dL (ref 3.5–5.0)
Alkaline Phosphatase: 58 U/L (ref 38–126)
Anion gap: 6 (ref 5–15)
BUN: 51 mg/dL — ABNORMAL HIGH (ref 8–23)
CO2: 22 mmol/L (ref 22–32)
Calcium: 9.1 mg/dL (ref 8.9–10.3)
Chloride: 108 mmol/L (ref 98–111)
Creatinine, Ser: 3.32 mg/dL — ABNORMAL HIGH (ref 0.44–1.00)
GFR, Estimated: 15 mL/min — ABNORMAL LOW (ref >60)
Glucose, Bld: 95 mg/dL (ref 70–99)
Potassium: 4.6 mmol/L (ref 3.5–5.1)
Sodium: 136 mmol/L (ref 135–145)
Total Bilirubin: 0.4 mg/dL (ref 0.3–1.2)
Total Protein: 8.3 g/dL — ABNORMAL HIGH (ref 6.5–8.1)

## 2021-10-10 LAB — VITAMIN B12: Vitamin B-12: 1315 pg/mL — ABNORMAL HIGH (ref 180–914)

## 2021-10-10 LAB — FERRITIN: Ferritin: 261 ng/mL (ref 11–307)

## 2021-10-10 LAB — FOLATE: Folate: 30 ng/mL (ref >5.9)

## 2021-10-12 ENCOUNTER — Ambulatory Visit: Payer: 59 | Admitting: Oncology

## 2021-10-12 ENCOUNTER — Other Ambulatory Visit: Payer: Self-pay

## 2021-10-12 ENCOUNTER — Inpatient Hospital Stay (HOSPITAL_BASED_OUTPATIENT_CLINIC_OR_DEPARTMENT_OTHER): Payer: 59 | Admitting: Oncology

## 2021-10-12 VITALS — BP 123/74 | HR 75 | Temp 97.9°F | Resp 16 | Wt 146.4 lb

## 2021-10-12 DIAGNOSIS — D631 Anemia in chronic kidney disease: Secondary | ICD-10-CM | POA: Diagnosis not present

## 2021-10-12 DIAGNOSIS — D509 Iron deficiency anemia, unspecified: Secondary | ICD-10-CM | POA: Diagnosis not present

## 2021-10-12 DIAGNOSIS — N184 Chronic kidney disease, stage 4 (severe): Secondary | ICD-10-CM

## 2021-10-12 NOTE — Progress Notes (Addendum)
Hematology/Oncology Consult note [facility] Telephone:([phone] [phone]   Patient Care Team: Donnamarie Rossetti, PA-C as PCP - General (Family Medicine)  REFERRING PROVIDER: Grayland Ormond Hestle,*  CHIEF COMPLAINTS/REASON FOR VISIT:  Evaluation of lymphadenopathy  HISTORY OF PRESENTING ILLNESS:  [facility] is a  60 y.o.  female with PMH listed below was seen in consultation at the request of  Donnamarie Rossetti,*  for evaluation of lymphadenopathy Patient previously following with [facility] for most of her care.  She recently wants to move her care locally. She was seen by primary care provider [facility] recently. Extensive medical records review via care everywhere was performed by me. She has a history of recurrent UTI, chronic dysuria and urinary frequency.  She was seen by urology previously. She also had a history of retroperitoneal lymphadenopathy first was on CT scan 03/05/2018 At that time CT showed multiple enlarged retroperitoneal lymph nodes which are suspicious for malignancy such as lymphoma.  Retroperitoneal lymph nodes are amendable to percutaneous biopsy.  Nonspecific 1.2 cm right hepatic lobe lesion. 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  Lesion was incompletely evaluated on a noncontrast study.  Differential is hemangioma due to stability and precontrast T2 signal. Patient was seen by [facility] hematology oncology on 01/24/2019.  Was recommended to have a follow-up MRI July 2020.  Patient did not have image follow-up done since then.  Patient denies any unintentional weight loss, night sweats, fatigue.  Fever or chills. She is currently on a course of antibiotics for dysuria/urgency treating for UTI.  Her urine on 01/27/2020 showed large amount of blood, positive leukocyte Esterace.  Patient has chronic kidney disease,  recent creatinine was 3, estimated GFR 19.  She was last seen by her [facility] nephrology Dr. Fredirick Maudlin on 06/04/2019.  Possible need of renal biopsy was discussed at that time. Patient's work-up were positive for a low positive titer ANA and ANCA titers. SPEP negative.  Recurrent UTI, previously seen by [facility] urology 11/08/2017  02/25/2020 CT abdomen pelvis showed left hydronephrosis, and hydroureter, new from 01/10/19.  Left retroperitoneal lymph node 1.4cm, slightly bigger comparing to 1.3 on 01/10/19 Left periaortic lymph node unchanged.  Small subpleural nodule in the right middle lobe measures 4 mm  11/29/2020 Seen by Dr.Sninsky for intermittent hematuria.  cystoscopy done in 2021 which was negative. Urology plans to repeat cystoscopy if she has persistent gross hematuria. She was recommended for a trial of topical estrogen cream for her recurrent UTIs and dyspareunia.   Her sister was diagnosed with gastric cancer   INTERVAL HISTORY Alejandra [facility] is a 60 year old female who presents with several medical problems who sees Dr. Tasia Catchings for unintentional weight loss and lymphadenopathy.  Work-up was essentially negative-peripheral flow cytometry and LDH were negative and lymphadenopathy resolved.  Additionally she was referred to a genetics counselor for strong family history of cancer.  She was found to be anemic with worsening CKD.  SPEP negative.  She sees nephrology.  It was recommended she began iron supplements 1 to 2 tablets daily and to recheck labs in 3 months.  Today, she is doing well.  Reports taking iron supplements as prescribed.  Has stable chronic fatigue.  Otherwise denies any new concerns.  No evidence of bleeding.  Denies any new lumps or bumps.  Reports stable low appetite although she has gained 3 pounds.  Review of Systems  Constitutional:  Negative for appetite change, fatigue, fever and unexpected weight change.  HENT:   Negative for nosebleeds, sore throat and trouble swallowing.    Eyes: Negative.   Respiratory: Negative.  Negative for cough, shortness of breath and wheezing.   Cardiovascular: Negative.  Negative for chest pain and leg swelling.  Gastrointestinal:  Negative for abdominal pain, blood in stool, constipation, diarrhea, nausea and vomiting.  Endocrine: Negative.   Genitourinary: Negative.  Negative for bladder incontinence, hematuria and nocturia.   Musculoskeletal: Negative.  Negative for back pain and flank pain.  Skin: Negative.   Neurological: Negative.  Negative for dizziness, headaches, light-headedness and numbness.  Hematological: Negative.   Psychiatric/Behavioral: Negative.  Negative for confusion. The patient is not nervous/anxious.   All other systems reviewed and are negative.  MEDICAL HISTORY:  Past Medical History:  Diagnosis Date   Allergy    Anemia    Family history of breast cancer    Family history of cancer of gallbladder    Family history of lung cancer    Family history of pancreatic cancer    GERD (gastroesophageal reflux disease)    Overactive bladder     SURGICAL HISTORY: Past Surgical History:  Procedure Laterality Date   BREAST EXCISIONAL BIOPSY     BREAST SURGERY     fibrocystic   CYSTOSCOPY W/ RETROGRADES Left 03/19/2020   Procedure: CYSTOSCOPY WITH RETROGRADE PYELOGRAM;  Surgeon: Billey Co, MD;  Location: [facility] ORS;  Service: Urology;  Laterality: Left;   CYSTOSCOPY WITH BIOPSY N/A 03/19/2020   Procedure: CYSTOSCOPY WITH bladder BIOPSY and fulgeration;  Surgeon: Billey Co, MD;  Location: [facility] ORS;  Service: Urology;  Laterality: N/A;   CYSTOSCOPY WITH URETEROSCOPY Left 03/19/2020   Procedure: CYSTOSCOPY WITH URETEROSCOPY;  Surgeon: Billey Co, MD;  Location: [facility] ORS;  Service: Urology;  Laterality: Left;   LAPAROTOMY     ectopic    SOCIAL HISTORY: Social History   Socioeconomic History   Marital status: Single    Spouse name: Not on file   Number of children: Not on file   Years of  education: Not on file   Highest education level: Not on file  Occupational History   Not on file  Tobacco Use   Smoking status: Never   Smokeless tobacco: Never  Vaping Use   Vaping Use: Never used  Substance and Sexual Activity   Alcohol use: Not Currently   Drug use: No   Sexual activity: Not Currently    Birth control/protection: None, Post-menopausal  Other Topics Concern   Not on file  Social History Narrative   Lives alone in [place]. Works as Sports coach.   Social Determinants of Health   Financial Resource Strain: Not on file  Food Insecurity: Not on file  Transportation Needs: Not on file  Physical Activity: Not on file  Stress: Not on file  Social Connections: Not on file  Intimate Partner Violence: Not on file    FAMILY HISTORY: Family History  Problem Relation Age of Onset   Pancreatic cancer Mother 37   Hypertension Mother    Lung cancer Father 52       hx smoking   Hypertension Father    Breast cancer Sister 5   Other Sister 28       gallbladder cancer   Bone cancer Maternal Grandmother 78   Breast cancer Paternal Grandmother 76    ALLERGIES:  has No Known Allergies.  MEDICATIONS:  Current Outpatient Medications  Medication Sig Dispense Refill  calcium citrate-vitamin D (CITRACAL+D) 315-200 MG-UNIT per tablet Take 1 tablet by mouth daily.      estradiol (ESTRACE) 0.1 MG/GM vaginal cream Estrogen Cream Instruction Discard applicator Apply pea sized amount to tip of finger to urethra before bed. Wash hands well after application. Use Monday, Wednesday and Friday 42.5 g 12   ferrous sulfate 325 (65 FE) MG EC tablet Take 1 tablet (325 mg total) by mouth 2 (two) times daily with a meal. 60 tablet 2   fexofenadine (ALLEGRA) 180 MG tablet Take 180 mg by mouth daily as needed for allergies or rhinitis.     Garlic-DHA-EPA (GARLIC/EPA PO) Take 1 tablet by mouth daily.     Multiple Vitamins-Minerals (ALIVE WOMENS ENERGY) TABS Take 1 tablet by mouth daily.       omeprazole (PRILOSEC) 20 MG capsule Take 20 mg by mouth daily.     Pseudoephedrine-Guaifenesin [phone] MG TB12 Take 1 tablet by mouth daily.     Specialty Vitamins Products (ONE-A-DAY CHOLESTEROL PLUS PO) Take 2 tablets by mouth in the morning and at bedtime.     No current facility-administered medications for this visit.     PHYSICAL EXAMINATION: ECOG PERFORMANCE STATUS: 1 - Symptomatic but completely ambulatory Vitals:   10/12/21 1248  BP: 123/74  Pulse: 75  Resp: 16  Temp: 97.9 F (36.6 C)  SpO2: 98%   Filed Weights   10/12/21 1248  Weight: 146 lb 6.4 oz (66.4 kg)    Physical Exam Constitutional:      Appearance: Normal appearance.  HENT:     Head: Normocephalic and atraumatic.  Eyes:     Pupils: Pupils are equal, round, and reactive to light.  Cardiovascular:     Rate and Rhythm: Normal rate and regular rhythm.     Heart sounds: Normal heart sounds. No murmur heard. Pulmonary:     Effort: Pulmonary effort is normal.     Breath sounds: Normal breath sounds. No wheezing.  Abdominal:     General: Bowel sounds are normal. There is no distension.     Palpations: Abdomen is soft.     Tenderness: There is no abdominal tenderness.  Musculoskeletal:        General: Normal range of motion.     Cervical back: Normal range of motion.  Skin:    General: Skin is warm and dry.     Findings: No rash.  Neurological:     Mental Status: She is alert and oriented to person, place, and time.  Psychiatric:        Judgment: Judgment normal.    LABORATORY DATA:  I have reviewed the data as listed Lab Results  Component Value Date   WBC 7.6 10/10/2021   HGB 9.0 (L) 10/10/2021   HCT 29.4 (L) 10/10/2021   MCV 92.5 10/10/2021   PLT 326 10/10/2021   Recent Labs    12/29/20 1012 03/01/21 1151 07/15/21 1028 10/10/21 1243  NA 137 139 135 136  K 5.3* 5.6* 4.3 4.6  CL 106 106 99 108  CO2 21* 19* 26 22  GLUCOSE 98 87 97 95  BUN 53* 64* 70* 51*  CREATININE 2.94*  3.27* 3.99* 3.32*  CALCIUM 9.1 9.9 9.6 9.1  GFRNONAA 18*  --  12* 15*  PROT 8.1  --  8.4* 8.3*  ALBUMIN 3.5  --  3.9 3.5  AST 36  --  18 15  ALT 40  --  17 18  ALKPHOS 104  --  56 58  BILITOT  0.5  --  0.7 0.4   Iron/TIBC/Ferritin/ %Sat    Component Value Date/Time   IRON 21 (L) 10/10/2021 1243   IRON 56 12/03/2012 1223   TIBC 230 (L) 10/10/2021 1243   TIBC 356 12/03/2012 1223   FERRITIN 261 10/10/2021 1243   FERRITIN 97 12/03/2012 1223   IRONPCTSAT 9 (L) 10/10/2021 1243   IRONPCTSAT 16 12/03/2012 1223      RADIOGRAPHIC STUDIES: I have personally reviewed the radiological images as listed and agreed with the findings in the report. No results found.    ASSESSMENT & PLAN: Alejandra Hill is a 60 year old female who presents back to clinic for follow-up and assess tolerance of oral iron. 1. Iron deficiency anemia, unspecified iron deficiency anemia type   2. Anemia due to stage 4 chronic kidney disease ([place])    Anemia-Noted to have anemia during work-up for unintentional weight loss and lymphadenopathy.  Hemoglobin from 07/15/2021 was 10 and today is 9.0.  Ferritin 261 with an iron saturation of 9%.  Folate is normal.  Vitamin B12 is elevated.  MCV 92.5.  She has CKD with creatinine baseline between 3 and 4.  In CKD, iron saturation should be closer to 20 to 30%.  Recommend 3 doses of IV Venofer.  If hemoglobin continues to be decreased, would recommend Retacrit as well.  We discussed side effects of IV Venofer that include hypertension, nausea, headache, muscle cramps and upper respiratory tract infection.  Less likely include itching, hyperglycemia, abdominal pain, diarrhea and vomiting.  Patient understood and has agreed to proceed with treatment.  She was initially referred for retroperitoneal lymphadenopathy although peripheral flow cytometry and LDH were negative.  Lymphadenopathy resolved spontaneously.  No additional work-up needed at this time.  She is found to have some  unintentional weight loss without clear etiology.  She has strong family history of cancer so she was referred to [facility].  Lab work including TSH was negative.  She had an MRI of her abdomen/pelvis which did not reveal any abnormality.  Her weight is up 3 pounds today.  Genetics counselor did not reveal any mutations that would increase her risk for developing cancer.  We will continue to monitor at this time.  Disposition-proceed with IV Venofer x3 doses.  Return to clinic in 3 months for repeat labs a few days before, see Dr. Tasia Catchings with possible IV Venofer and/or Retacrit.   No orders of the defined types were placed in this encounter.   All questions were answered. The patient knows to call the clinic with any problems questions or concerns.  cc Venetia Maxon, Corene Cornea Hestle,*   I spent 25 minutes dedicated to the care of this patient (face-to-face and non-face-to-face) on the date of the encounter to include what is described in the assessment and plan.  Faythe Casa, NP 10/12/2021 2:29 PM

## 2021-10-12 NOTE — Progress Notes (Signed)
Pt has no concerns at this time. 

## 2021-10-14 ENCOUNTER — Other Ambulatory Visit: Payer: Self-pay | Admitting: Oncology

## 2021-10-14 DIAGNOSIS — D631 Anemia in chronic kidney disease: Secondary | ICD-10-CM

## 2021-10-14 DIAGNOSIS — N189 Chronic kidney disease, unspecified: Secondary | ICD-10-CM

## 2021-10-18 ENCOUNTER — Other Ambulatory Visit: Payer: Self-pay

## 2021-10-18 ENCOUNTER — Inpatient Hospital Stay: Payer: 59

## 2021-10-18 VITALS — BP 127/71 | HR 85 | Temp 97.3°F | Resp 18

## 2021-10-18 DIAGNOSIS — N189 Chronic kidney disease, unspecified: Secondary | ICD-10-CM

## 2021-10-18 DIAGNOSIS — D509 Iron deficiency anemia, unspecified: Secondary | ICD-10-CM | POA: Diagnosis not present

## 2021-10-18 DIAGNOSIS — D631 Anemia in chronic kidney disease: Secondary | ICD-10-CM

## 2021-10-18 MED ORDER — SODIUM CHLORIDE 0.9 % IV SOLN: 200.0000 mg | Freq: Once | INTRAVENOUS | Status: DC

## 2021-10-18 MED ORDER — IRON SUCROSE 20 MG/ML IV SOLN
200.0000 mg | Freq: Once | INTRAVENOUS | Status: AC
  Administered 2021-10-18: 200 mg via INTRAVENOUS
  Filled 2021-10-18: qty 10

## 2021-10-18 MED ORDER — SODIUM CHLORIDE 0.9 % IV SOLN
Freq: Once | INTRAVENOUS | Status: AC
  Filled 2021-10-18: qty 250

## 2021-10-18 NOTE — Patient Instructions (Signed)
[facility]  Discharge Instructions: Thank you for choosing [facility] to provide your oncology and hematology care.  If you have a lab appointment with the [facility], please go directly to the [facility] and check in at the registration area.  Wear comfortable clothing and clothing appropriate for easy access to any Portacath or PICC line.   We strive to give you quality time with your provider. You may need to reschedule your appointment if you arrive late (15 or more minutes).  Arriving late affects you and other patients whose appointments are after yours.  Also, if you miss three or more appointments without notifying the office, you may be dismissed from the clinic at the provider's discretion.      For prescription refill requests, have your pharmacy contact our office and allow 72 hours for refills to be completed.    Today you received the following chemotherapy and/or immunotherapy agents VENOFER      To help prevent nausea and vomiting after your treatment, we encourage you to take your nausea medication as directed.  BELOW ARE SYMPTOMS THAT SHOULD BE REPORTED IMMEDIATELY: *FEVER GREATER THAN 100.4 F (38 C) OR HIGHER *CHILLS OR SWEATING *NAUSEA AND VOMITING THAT IS NOT CONTROLLED WITH YOUR NAUSEA MEDICATION *UNUSUAL SHORTNESS OF BREATH *UNUSUAL BRUISING OR BLEEDING *URINARY PROBLEMS (pain or burning when urinating, or frequent urination) *BOWEL PROBLEMS (unusual diarrhea, constipation, pain near the anus) TENDERNESS IN MOUTH AND THROAT WITH OR WITHOUT PRESENCE OF ULCERS (sore throat, sores in mouth, or a toothache) UNUSUAL RASH, SWELLING OR PAIN  UNUSUAL VAGINAL DISCHARGE OR ITCHING   Items with * indicate a potential emergency and should be followed up as soon as possible or go to the Emergency Department if any problems should occur.  Please show the CHEMOTHERAPY ALERT CARD or IMMUNOTHERAPY ALERT CARD at check-in to  the Emergency Department and triage nurse.  Should you have questions after your visit or need to cancel or reschedule your appointment, please contact [facility]  [phone] and follow the prompts.  Office hours are 8:00 a.m. to 4:30 p.m. Monday - Friday. Please note that voicemails left after 4:00 p.m. may not be returned until the following business day.  We are closed weekends and major holidays. You have access to a nurse at all times for urgent questions. Please call the main number to the clinic [phone] and follow the prompts.  For any non-urgent questions, you may also contact your provider using MyChart. We now offer e-Visits for anyone 18 and older to request care online for non-urgent symptoms. For details visit [email or web address].   Also download the MyChart app! Go to the app store, search "MyChart", open the app, select [facility], and log in with your MyChart username and password.  Due to Covid, a mask is required upon entering the hospital/clinic. If you do not have a mask, one will be given to you upon arrival. For doctor visits, patients may have 1 support person aged 18 or older with them. For treatment visits, patients cannot have anyone with them due to current Covid guidelines and our immunocompromised population.   Iron Sucrose Injection What is this medication? IRON SUCROSE (EYE ern SOO krose) treats low levels of iron (iron deficiency anemia) in people with kidney disease. Iron is a mineral that plays an important role in making red blood cells, which carry oxygen from your lungs to the rest of your body. This medicine may   be used for other purposes; ask your health care provider or pharmacist if you have questions. COMMON BRAND NAME(S): Venofer What should I tell my care team before I take this medication? They need to know if you have any of these conditions: Anemia not caused by low iron levels Heart disease High levels  of iron in the blood Kidney disease Liver disease An unusual or allergic reaction to iron, other medications, foods, dyes, or preservatives Pregnant or trying to get pregnant Breast-feeding How should I use this medication? This medication is for infusion into a vein. It is given in a hospital or clinic setting. Talk to your care team about the use of this medication in children. While this medication may be prescribed for children as young as 2 years for selected conditions, precautions do apply. Overdosage: If you think you have taken too much of this medicine contact a poison [facility] or emergency room at once. NOTE: This medicine is only for you. Do not share this medicine with others. What if I miss a dose? It is important not to miss your dose. Call your care team if you are unable to keep an appointment. What may interact with this medication? Do not take this medication with any of the following: Deferoxamine Dimercaprol Other iron products This medication may also interact with the following: Chloramphenicol Deferasirox This list may not describe all possible interactions. Give your health care provider a list of all the medicines, herbs, non-prescription drugs, or dietary supplements you use. Also tell them if you smoke, drink alcohol, or use illegal drugs. Some items may interact with your medicine. What should I watch for while using this medication? Visit your care team regularly. Tell your care team if your symptoms do not start to get better or if they get worse. You may need blood work done while you are taking this medication. You may need to follow a special diet. Talk to your care team. Foods that contain iron include: whole grains/cereals, dried fruits, beans, or peas, leafy green vegetables, and organ meats (liver, kidney). What side effects may I notice from receiving this medication? Side effects that you should report to your care team as soon as  possible: Allergic reactions-skin rash, itching, hives, swelling of the face, lips, tongue, or throat Low blood pressure-dizziness, feeling faint or lightheaded, blurry vision Shortness of breath Side effects that usually do not require medical attention (report to your care team if they continue or are bothersome): Flushing Headache Joint pain Muscle pain Nausea Pain, redness, or irritation at injection site This list may not describe all possible side effects. Call your doctor for medical advice about side effects. You may report side effects to FDA at 1-800-FDA-1088. Where should I keep my medication? This medication is given in a hospital or clinic and will not be stored at home. NOTE: This sheet is a summary. It may not cover all possible information. If you have questions about this medicine, talk to your doctor, pharmacist, or health care provider.  2022 Elsevier/Gold Standard (2021-03-08 12:52:06)  

## 2021-10-21 ENCOUNTER — Inpatient Hospital Stay: Payer: 59

## 2021-10-27 ENCOUNTER — Inpatient Hospital Stay: Payer: 59 | Attending: Oncology

## 2021-10-27 ENCOUNTER — Other Ambulatory Visit: Payer: Self-pay

## 2021-10-27 VITALS — BP 101/60 | HR 72 | Temp 97.2°F | Resp 16

## 2021-10-27 DIAGNOSIS — N184 Chronic kidney disease, stage 4 (severe): Secondary | ICD-10-CM | POA: Insufficient documentation

## 2021-10-27 DIAGNOSIS — N189 Chronic kidney disease, unspecified: Secondary | ICD-10-CM

## 2021-10-27 DIAGNOSIS — D631 Anemia in chronic kidney disease: Secondary | ICD-10-CM

## 2021-10-27 DIAGNOSIS — D509 Iron deficiency anemia, unspecified: Secondary | ICD-10-CM | POA: Insufficient documentation

## 2021-10-27 MED ORDER — IRON SUCROSE 20 MG/ML IV SOLN
200.0000 mg | Freq: Once | INTRAVENOUS | Status: AC
  Administered 2021-10-27: 200 mg via INTRAVENOUS
  Filled 2021-10-27: qty 10

## 2021-10-27 MED ORDER — SODIUM CHLORIDE 0.9 % IV SOLN: 200.0000 mg | Freq: Once | INTRAVENOUS | Status: DC

## 2021-10-27 MED ORDER — SODIUM CHLORIDE 0.9 % IV SOLN
Freq: Once | INTRAVENOUS | Status: AC
  Filled 2021-10-27: qty 250

## 2021-10-27 NOTE — Patient Instructions (Signed)

## 2021-11-01 ENCOUNTER — Telehealth (INDEPENDENT_AMBULATORY_CARE_PROVIDER_SITE_OTHER): Payer: Self-pay

## 2021-11-01 NOTE — Telephone Encounter (Signed)
I attempted to contact the patient to schedule a left brachial axillary fistula with Dr. Delana Meyer. A message was left for a return call.

## 2021-11-03 ENCOUNTER — Telehealth: Payer: Self-pay | Admitting: Oncology

## 2021-11-03 ENCOUNTER — Inpatient Hospital Stay: Payer: 59

## 2021-11-03 NOTE — Telephone Encounter (Signed)
Pt forgot her appointment today. Please call he to reschedule at [phone]

## 2021-11-04 ENCOUNTER — Telehealth: Payer: Self-pay | Admitting: Oncology

## 2021-11-04 NOTE — Telephone Encounter (Signed)
Pt called in to reschedule her appt. Please give he call back at [phone]

## 2021-11-07 NOTE — Telephone Encounter (Signed)
Left VM to have pt call us back to reschedule appts.

## 2021-11-08 NOTE — Telephone Encounter (Signed)
Called pt and got her voicemail and left message that we are trying to get in touch with her to reschedule the iron infusion that you missed. Please contact our office to help get you set up and left direct number to call back

## 2021-11-21 ENCOUNTER — Other Ambulatory Visit: Payer: 59

## 2021-11-23 ENCOUNTER — Ambulatory Visit: Payer: 59 | Admitting: Oncology

## 2021-11-23 ENCOUNTER — Telehealth: Payer: Self-pay | Admitting: Oncology

## 2021-11-23 NOTE — Telephone Encounter (Signed)
Left VM with patient to reschedule missed iron infusion. Left direct ext for her to return call.

## 2021-11-28 ENCOUNTER — Telehealth (INDEPENDENT_AMBULATORY_CARE_PROVIDER_SITE_OTHER): Payer: Self-pay

## 2021-11-28 NOTE — Telephone Encounter (Signed)
I attempted to contact the patient to schedule a left brachial cephalic fistula with Dr. Delana Meyer and a message was left for a return call.

## 2021-12-22 ENCOUNTER — Encounter: Payer: Self-pay | Admitting: Oncology

## 2021-12-25 HISTORY — PX: OTHER SURGICAL HISTORY: SHX169

## 2021-12-30 ENCOUNTER — Telehealth (INDEPENDENT_AMBULATORY_CARE_PROVIDER_SITE_OTHER): Payer: Self-pay

## 2021-12-30 NOTE — Telephone Encounter (Signed)
Spoke with the patient to get her insurance information as she does not have the [employer] that is in the chart. Patient stated she has [employer]. Patient stated she has death in the family and will contact us next week after taking care of this.

## 2022-01-05 ENCOUNTER — Other Ambulatory Visit: Payer: Self-pay | Admitting: *Deleted

## 2022-01-05 DIAGNOSIS — N189 Chronic kidney disease, unspecified: Secondary | ICD-10-CM

## 2022-01-05 DIAGNOSIS — D509 Iron deficiency anemia, unspecified: Secondary | ICD-10-CM

## 2022-01-05 DIAGNOSIS — D631 Anemia in chronic kidney disease: Secondary | ICD-10-CM

## 2022-01-11 ENCOUNTER — Encounter: Payer: Self-pay | Admitting: Oncology

## 2022-01-12 ENCOUNTER — Encounter: Payer: Self-pay | Admitting: Oncology

## 2022-01-12 ENCOUNTER — Inpatient Hospital Stay: Payer: 59

## 2022-01-12 ENCOUNTER — Inpatient Hospital Stay: Payer: 59 | Attending: Oncology | Admitting: Oncology

## 2022-01-12 ENCOUNTER — Other Ambulatory Visit: Payer: Self-pay

## 2022-01-12 VITALS — BP 130/58 | HR 80 | Resp 16

## 2022-01-12 VITALS — BP 113/64 | HR 87 | Temp 97.0°F | Wt 137.0 lb

## 2022-01-12 DIAGNOSIS — D631 Anemia in chronic kidney disease: Secondary | ICD-10-CM

## 2022-01-12 DIAGNOSIS — R591 Generalized enlarged lymph nodes: Secondary | ICD-10-CM

## 2022-01-12 DIAGNOSIS — R634 Abnormal weight loss: Secondary | ICD-10-CM | POA: Diagnosis not present

## 2022-01-12 DIAGNOSIS — N189 Chronic kidney disease, unspecified: Secondary | ICD-10-CM

## 2022-01-12 DIAGNOSIS — Z809 Family history of malignant neoplasm, unspecified: Secondary | ICD-10-CM | POA: Diagnosis not present

## 2022-01-12 DIAGNOSIS — N184 Chronic kidney disease, stage 4 (severe): Secondary | ICD-10-CM

## 2022-01-12 DIAGNOSIS — Z800 Family history of malignant neoplasm of digestive organs: Secondary | ICD-10-CM | POA: Diagnosis not present

## 2022-01-12 DIAGNOSIS — D509 Iron deficiency anemia, unspecified: Secondary | ICD-10-CM

## 2022-01-12 LAB — CBC WITH DIFFERENTIAL/PLATELET
Abs Immature Granulocytes: 0.06 10*3/uL (ref 0.00–0.07)
Basophils Absolute: 0 10*3/uL (ref 0.0–0.1)
Basophils Relative: 0 %
Eosinophils Absolute: 0 10*3/uL (ref 0.0–0.5)
Eosinophils Relative: 0 %
HCT: 29 % — ABNORMAL LOW (ref 36.0–46.0)
Hemoglobin: 8.9 g/dL — ABNORMAL LOW (ref 12.0–15.0)
Immature Granulocytes: 1 %
Lymphocytes Relative: 12 %
Lymphs Abs: 1.7 10*3/uL (ref 0.7–4.0)
MCH: 29.1 pg (ref 26.0–34.0)
MCHC: 30.7 g/dL (ref 30.0–36.0)
MCV: 94.8 fL (ref 80.0–100.0)
Monocytes Absolute: 1.4 10*3/uL — ABNORMAL HIGH (ref 0.1–1.0)
Monocytes Relative: 10 %
Neutro Abs: 10.2 10*3/uL — ABNORMAL HIGH (ref 1.7–7.7)
Neutrophils Relative %: 77 %
Platelets: 480 10*3/uL — ABNORMAL HIGH (ref 150–400)
RBC: 3.06 MIL/uL — ABNORMAL LOW (ref 3.87–5.11)
RDW: 14.6 % (ref 11.5–15.5)
WBC: 13.3 10*3/uL — ABNORMAL HIGH (ref 4.0–10.5)
nRBC: 0 % (ref 0.0–0.2)

## 2022-01-12 LAB — IRON AND TIBC
Iron: 34 ug/dL (ref 28–170)
Saturation Ratios: 21 % (ref 10.4–31.8)
TIBC: 160 ug/dL — ABNORMAL LOW (ref 250–450)
UIBC: 126 ug/dL

## 2022-01-12 LAB — FERRITIN: Ferritin: 535 ng/mL — ABNORMAL HIGH (ref 11–307)

## 2022-01-12 MED ORDER — SODIUM CHLORIDE 0.9 % IV SOLN: 200.0000 mg | Freq: Once | INTRAVENOUS | Status: DC

## 2022-01-12 MED ORDER — IRON SUCROSE 20 MG/ML IV SOLN
200.0000 mg | Freq: Once | INTRAVENOUS | Status: AC
  Administered 2022-01-12: 200 mg via INTRAVENOUS
  Filled 2022-01-12: qty 10

## 2022-01-12 MED ORDER — SODIUM CHLORIDE 0.9 % IV SOLN
Freq: Once | INTRAVENOUS | Status: AC
  Filled 2022-01-12: qty 250

## 2022-01-12 NOTE — Progress Notes (Signed)
Hematology/Oncology progress note  [phone] [phone]   Patient Care Team: Donnamarie Rossetti, PA-C as PCP - General (Family Medicine) Earlie Server, MD as Consulting Physician (Hematology and Oncology)  REFERRING PROVIDER: Grayland Ormond Hestle,*  CHIEF COMPLAINTS/REASON FOR VISIT:  Follow-up for unintentional weight loss, anemia and chronic kidney disease.  HISTORY OF PRESENTING ILLNESS:   Alejandra Hill is a  61 y.o.  female with PMH listed below was seen in consultation at the request of  Donnamarie Rossetti,*  for evaluation of lymphadenopathy Patient previously following with [facility] for most of her care.  She recently wants to move her care locally. She was seen by primary care provider [facility] recently. Extensive medical records review via care everywhere was performed by me. She has a history of recurrent UTI, chronic dysuria and urinary frequency.  She was seen by urology previously. She also had a history of retroperitoneal lymphadenopathy first was on CT scan 03/05/2018 At that time CT showed multiple enlarged retroperitoneal lymph nodes which are suspicious for malignancy such as lymphoma.  Retroperitoneal lymph nodes are amendable to percutaneous biopsy.  Nonspecific 1.2 cm right hepatic lobe lesion. 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  Lesion was incompletely evaluated on a noncontrast study.  Differential is hemangioma due to stability and precontrast T2 signal. Patient was seen by [facility] hematology oncology on 01/24/2019.  Was recommended to have a follow-up MRI July 2020.  Patient did not have image follow-up done since then.  Patient denies any unintentional weight loss, night sweats, fatigue.  Fever or chills. She is currently on a course of antibiotics for dysuria/urgency treating for UTI.  Her urine on 01/27/2020 showed large  amount of blood, positive leukocyte Esterace.  Patient has chronic kidney disease, recent creatinine was 3, estimated GFR 19.  She was last seen by her [facility] nephrology Dr. Fredirick Maudlin on 06/04/2019.  Possible need of renal biopsy was discussed at that time. Patient's work-up were positive for a low positive titer ANA and ANCA titers. SPEP negative.  Recurrent UTI, previously seen by [facility] urology 11/08/2017  02/25/2020 CT abdomen pelvis showed left hydronephrosis, and hydroureter, new from 01/10/19.  Left retroperitoneal lymph node 1.4cm, slightly bigger comparing to 1.3 on 01/10/19 Left periaortic lymph node unchanged.  Small subpleural nodule in the right middle lobe measures 4 mm  11/29/2020 Seen by Dr.Sninsky for intermittent hematuria.  cystoscopy done in 2021 which was negative. Urology plans to repeat cystoscopy if she has persistent gross hematuria. She was recommended for a trial of topical estrogen cream for her recurrent UTIs and dyspareunia.   Her sister was diagnosed with gastric cancer  01/10/21 MRI abdome w/wo contrast Moderate left renal atrophy and bilateral renal parenchymal scarring. No evidence of renal mass.  Resolution of left hydronephrosis since previous study. Stable diffuse bladder wall thickening, consistent with chronic cystitis.  Multiple small uterine fibroids measuring up to 1.7 cm. Small benign hepatic hemangioma.   INTERVAL HISTORY Alejandra Hill is a 61 y.o. female who has above history reviewed by me today presents for follow up visit for weight loss, anemia of chronic kidney disease.  Patient continues to lose weight.  She has lost 9 pounds since October 2022. Reports feeling fatigued.   Review of Systems  Constitutional:  Positive for fatigue and unexpected weight change. Negative for appetite change, chills and fever.  HENT:   Negative for hearing  loss and voice change.   Eyes:  Negative for eye problems.  Respiratory:  Negative for chest tightness and  cough.   Cardiovascular:  Negative for chest pain.  Gastrointestinal:  Negative for abdominal distention, abdominal pain and blood in stool.  Endocrine: Negative for hot flashes.  Genitourinary:  Negative for difficulty urinating, dysuria and frequency.   Musculoskeletal:  Negative for arthralgias.  Skin:  Negative for itching and rash.  Neurological:  Negative for extremity weakness.  Hematological:  Negative for adenopathy.  Psychiatric/Behavioral:  Negative for confusion.    MEDICAL HISTORY:  Past Medical History:  Diagnosis Date   Allergy    Anemia    Family history of breast cancer    Family history of cancer of gallbladder    Family history of lung cancer    Family history of pancreatic cancer    GERD (gastroesophageal reflux disease)    Overactive bladder     SURGICAL HISTORY: Past Surgical History:  Procedure Laterality Date   BREAST EXCISIONAL BIOPSY     BREAST SURGERY     fibrocystic   CYSTOSCOPY W/ RETROGRADES Left 03/19/2020   Procedure: CYSTOSCOPY WITH RETROGRADE PYELOGRAM;  Surgeon: Billey Co, MD;  Location: [facility] ORS;  Service: Urology;  Laterality: Left;   CYSTOSCOPY WITH BIOPSY N/A 03/19/2020   Procedure: CYSTOSCOPY WITH bladder BIOPSY and fulgeration;  Surgeon: Billey Co, MD;  Location: [facility] ORS;  Service: Urology;  Laterality: N/A;   CYSTOSCOPY WITH URETEROSCOPY Left 03/19/2020   Procedure: CYSTOSCOPY WITH URETEROSCOPY;  Surgeon: Billey Co, MD;  Location: [facility] ORS;  Service: Urology;  Laterality: Left;   LAPAROTOMY     ectopic    SOCIAL HISTORY: Social History   Socioeconomic History   Marital status: Single    Spouse name: Not on file   Number of children: Not on file   Years of education: Not on file   Highest education level: Not on file  Occupational History   Not on file  Tobacco Use   Smoking status: Never   Smokeless tobacco: Never  Vaping Use   Vaping Use: Never used  Substance and Sexual Activity   Alcohol use: Not  Currently   Drug use: No   Sexual activity: Not Currently    Birth control/protection: None, Post-menopausal  Other Topics Concern   Not on file  Social History Narrative   Lives alone in [place]. Works as Sports coach.   Social Determinants of Health   Financial Resource Strain: Not on file  Food Insecurity: Not on file  Transportation Needs: Not on file  Physical Activity: Not on file  Stress: Not on file  Social Connections: Not on file  Intimate Partner Violence: Not on file    FAMILY HISTORY: Family History  Problem Relation Age of Onset   Pancreatic cancer Mother 72   Hypertension Mother    Lung cancer Father 45       hx smoking   Hypertension Father    Breast cancer Sister 62   Other Sister 96       gallbladder cancer   Bone cancer Maternal Grandmother 78   Breast cancer Paternal Grandmother 76    ALLERGIES:  has No Known Allergies.  MEDICATIONS:  Current Outpatient Medications  Medication Sig Dispense Refill   calcium citrate-vitamin D (CITRACAL+D) 315-200 MG-UNIT per tablet Take 1 tablet by mouth daily.      estradiol (ESTRACE) 0.1 MG/GM vaginal cream Estrogen Cream Instruction Discard applicator Apply pea sized amount to tip of  finger to urethra before bed. Wash hands well after application. Use Monday, Wednesday and Friday 42.5 g 12   ferrous sulfate 325 (65 FE) MG EC tablet Take 1 tablet (325 mg total) by mouth 2 (two) times daily with a meal. 60 tablet 2   fexofenadine (ALLEGRA) 180 MG tablet Take 180 mg by mouth daily as needed for allergies or rhinitis.     Garlic-DHA-EPA (GARLIC/EPA PO) Take 1 tablet by mouth daily.     Multiple Vitamins-Minerals (ALIVE WOMENS ENERGY) TABS Take 1 tablet by mouth daily.      omeprazole (PRILOSEC) 20 MG capsule Take 20 mg by mouth daily.     Pseudoephedrine-Guaifenesin [phone] MG TB12 Take 1 tablet by mouth daily.     Specialty Vitamins Products (ONE-A-DAY CHOLESTEROL PLUS PO) Take 2 tablets by mouth in the morning and  at bedtime.     No current facility-administered medications for this visit.     PHYSICAL EXAMINATION: ECOG PERFORMANCE STATUS: 1 - Symptomatic but completely ambulatory Vitals:   01/12/22 1356  BP: 113/64  Pulse: 87  Temp: (!) 97 F (36.1 C)   Filed Weights   01/12/22 1356  Weight: 137 lb (62.1 kg)    Physical Exam Constitutional:      General: She is not in acute distress. HENT:     Head: Normocephalic and atraumatic.  Eyes:     General: No scleral icterus. Cardiovascular:     Rate and Rhythm: Normal rate and regular rhythm.     Heart sounds: Normal heart sounds.  Pulmonary:     Effort: Pulmonary effort is normal. No respiratory distress.     Breath sounds: No wheezing.  Abdominal:     General: Bowel sounds are normal. There is no distension.     Palpations: Abdomen is soft.  Musculoskeletal:        General: No deformity. Normal range of motion.     Cervical back: Normal range of motion and neck supple.  Skin:    General: Skin is warm and dry.     Findings: No erythema or rash.  Neurological:     Mental Status: She is alert and oriented to person, place, and time. Mental status is at baseline.     Cranial Nerves: No cranial nerve deficit.     Coordination: Coordination normal.  Psychiatric:        Mood and Affect: Mood normal.    LABORATORY DATA:  I have reviewed the data as listed Lab Results  Component Value Date   WBC 13.3 (H) 01/12/2022   HGB 8.9 (L) 01/12/2022   HCT 29.0 (L) 01/12/2022   MCV 94.8 01/12/2022   PLT 480 (H) 01/12/2022   Recent Labs    03/01/21 1151 07/15/21 1028 10/10/21 1243  NA 139 135 136  K 5.6* 4.3 4.6  CL 106 99 108  CO2 19* 26 22  GLUCOSE 87 97 95  BUN 64* 70* 51*  CREATININE 3.27* 3.99* 3.32*  CALCIUM 9.9 9.6 9.1  GFRNONAA  --  12* 15*  PROT  --  8.4* 8.3*  ALBUMIN  --  3.9 3.5  AST  --  18 15  ALT  --  17 18  ALKPHOS  --  56 58  BILITOT  --  0.7 0.4    Iron/TIBC/Ferritin/ %Sat    Component Value  Date/Time   IRON 34 01/12/2022 1345   IRON 56 12/03/2012 1223   TIBC 160 (L) 01/12/2022 1345   TIBC 356 12/03/2012 1223  FERRITIN 535 (H) 01/12/2022 1345   FERRITIN 97 12/03/2012 1223   IRONPCTSAT 21 01/12/2022 1345   IRONPCTSAT 16 12/03/2012 1223      RADIOGRAPHIC STUDIES: I have personally reviewed the radiological images as listed and agreed with the findings in the report. No results found.    ASSESSMENT & PLAN:  1. Anemia due to stage 4 chronic kidney disease ([place])   2. Weight loss   3. Stage 4 chronic kidney disease ([place])   4. Family history of cancer    #History of retroperitoneal lymphadenopathy.Peripheral flow cytometry negative, LDH negative. Patient had MRI abdomen pelvis in Jan 2022, lymph nodes have resolved.  # Unintentional weight loss - Jan 2022 MRI abdomen pelvis no suspicious liver mass, no stomach/bowel obstruction.inflammation, fluid collection. No lymphadenopathy.  Normal TSH, etiology unclear. Family history of gastric cancer.  Genetic testing was negative.  Patient has upcoming gastroenterology work-up  # CKD, worseing creatinine. SPEP negative- 02/01/2021,  # Anemia of chronic kidney disease.  Hemoglobin 8.9 today.  Iron panel was not reviewed during her visit.  She gets 1 dose of empiric Venofer today. I recommend patient to be started on erythropoietin replacement therapy with Retacrit. Rationale and potential side effects were reviewed and discussed with her.  She agrees with the plan. We will schedule patient to start first dose of Retacrit 20,000 units.  Repeat H&H in 4 weeks and 8 weeks +/- Retacrit. Follow up in 3 months, Labs+/- Retacrit.   Orders Placed This Encounter  Procedures   Ferritin    Standing Status:   Future    Standing Expiration Date:   07/12/2022   CBC with Differential/Platelet    Standing Status:   Future    Standing Expiration Date:   01/12/2023   Iron and TIBC([facility])    Standing Status:   Future    Standing  Expiration Date:   01/12/2023   Hemoglobin and Hematocrit, Blood    Standing Status:   Future    Standing Expiration Date:   01/12/2023   Hemoglobin and Hematocrit, Blood    Standing Status:   Future    Standing Expiration Date:   01/12/2023    All questions were answered. The patient knows to call the clinic with any problems questions or concerns.  cc Grayland Ormond Hestle,*    Earlie Server, MD, PhD  01/12/2022

## 2022-01-12 NOTE — Patient Instructions (Signed)

## 2022-01-12 NOTE — Addendum Note (Signed)
Addended by: Earlie Server on: 01/12/2022 08:43 PM   Modules accepted: Orders

## 2022-01-13 ENCOUNTER — Telehealth: Payer: Self-pay

## 2022-01-13 NOTE — Telephone Encounter (Signed)
-----   Message from Earlie Server, MD sent at 01/12/2022  8:44 PM EST ----- Please arrange her to get retacrit .  Follow up plan,  H&H in 4 weeks and 8 weeks +/- Retacrit. Follow up in 3 months, Labs+/- Retacrit.  Labs are ordered

## 2022-01-13 NOTE — Telephone Encounter (Signed)
please arrange retacrit today or next week (per pt availability) lab/ poss retacrit 4 weeks and 8 weeks after this retacrit.  lab/ MD/ retacrit in 3 months   Please inform pt of appt.

## 2022-01-13 NOTE — Telephone Encounter (Signed)
Attempted to contact pt regarding appointment scheduling. Left VM and asked to call back and stated she could reach out via MyChart as well. Will try again to call and get appts scheduled

## 2022-01-16 NOTE — Telephone Encounter (Signed)
Alejandra Hill will you reach out to pt again please

## 2022-01-17 ENCOUNTER — Encounter: Payer: Self-pay | Admitting: Oncology

## 2022-01-17 NOTE — Telephone Encounter (Signed)
Attempted to contact pt & pt sister to schedule appts. NO answer, Left VM on Pt phone and unable to leave voicemail in sisters phone

## 2022-02-20 ENCOUNTER — Ambulatory Visit: Admission: RE | Admit: 2022-02-20 | Payer: 59 | Source: Ambulatory Visit | Admitting: Gastroenterology

## 2022-02-20 ENCOUNTER — Encounter: Admission: RE | Payer: Self-pay | Source: Ambulatory Visit

## 2022-02-20 SURGERY — COLONOSCOPY WITH PROPOFOL
Anesthesia: General

## 2022-03-09 ENCOUNTER — Ambulatory Visit (INDEPENDENT_AMBULATORY_CARE_PROVIDER_SITE_OTHER): Payer: 59 | Admitting: Vascular Surgery

## 2022-03-09 ENCOUNTER — Encounter (INDEPENDENT_AMBULATORY_CARE_PROVIDER_SITE_OTHER): Payer: Self-pay | Admitting: Vascular Surgery

## 2022-03-09 ENCOUNTER — Other Ambulatory Visit: Payer: Self-pay

## 2022-03-09 VITALS — BP 157/73 | HR 80 | Resp 16 | Ht 64.0 in | Wt 134.6 lb

## 2022-03-09 DIAGNOSIS — N185 Chronic kidney disease, stage 5: Secondary | ICD-10-CM

## 2022-03-09 DIAGNOSIS — I10 Essential (primary) hypertension: Secondary | ICD-10-CM | POA: Diagnosis not present

## 2022-03-09 NOTE — Progress Notes (Signed)
? ? ?MRN : [id number] ? ?Alejandra Hill is a 61 y.o. (16-Apr-1961) female who presents with chief complaint of need a port for dialysis. ? ?History of Present Illness:  ? ?The patient is seen for evaluation for dialysis access. The patient has chronic renal insufficiency stage V secondary to hypertension and diabetes. The patient's most recent creatinine clearance is less than 20. The patient volume status has not yet become an issue. Patient's blood pressures been relatively well controlled. There are mild uremic symptoms which appear to be relatively well tolerated at this time. The patient is right-handed. ? ?The patient has been considering the various methods of dialysis and wishes to proceed with hemodialysis and therefore creation of AV access. ? ?The patient denies amaurosis fugax or recent TIA symptoms. There are no recent neurological changes noted. ?The patient denies claudication symptoms or rest pain symptoms. ?The patient denies history of DVT, PE or superficial thrombophlebitis. ?The patient denies recent episodes of angina or shortness of breath.   ? ?Vein mapping shows the left cephalic vein is >2.3 mm at the antecubital fossa ? ?Current Meds  ?Medication Sig  ? apixaban (ELIQUIS) 5 MG TABS tablet Take by mouth.  ? calcium citrate-vitamin D (CITRACAL+D) 315-200 MG-UNIT per tablet Take 1 tablet by mouth daily.   ? ciprofloxacin (CIPRO) 500 MG tablet Take 500 mg by mouth 2 (two) times daily.  ? ferrous sulfate 325 (65 FE) MG EC tablet Take 1 tablet (325 mg total) by mouth 2 (two) times daily with a meal.  ? Multiple Vitamins-Minerals (ALIVE WOMENS ENERGY) TABS Take 1 tablet by mouth daily.   ? ? ?Past Medical History:  ?Diagnosis Date  ? Allergy   ? Anemia   ? Family history of breast cancer   ? Family history of cancer of gallbladder   ? Family history of lung cancer   ? Family history of pancreatic cancer   ? GERD (gastroesophageal reflux disease)   ? Overactive bladder   ? ? ?Past Surgical History:   ?Procedure Laterality Date  ? BREAST EXCISIONAL BIOPSY    ? BREAST SURGERY    ? fibrocystic  ? CYSTOSCOPY W/ RETROGRADES Left 03/19/2020  ? Procedure: CYSTOSCOPY WITH RETROGRADE PYELOGRAM;  Surgeon: Billey Co, MD;  Location: [facility] ORS;  Service: Urology;  Laterality: Left;  ? CYSTOSCOPY WITH BIOPSY N/A 03/19/2020  ? Procedure: CYSTOSCOPY WITH bladder BIOPSY and fulgeration;  Surgeon: Billey Co, MD;  Location: [facility] OR[facility];  Service: Urology;  Laterality: N/A;  ? CYSTOSCOPY WITH URETEROSCOPY Left 03/19/2020  ? Procedure: CYSTOSCOPY WITH URETEROSCOPY;  Surgeon: Billey Co, MD;  Location: [facility] OR[facility];  Service: Urology;  Laterality: Left;  ? LAPAROTOMY    ? ectopic  ? ? ?Social History ?Social History  ? ?Tobacco Use  ? Smoking status: Never  ? Smokeless tobacco: Never  ?Vaping Use  ? Vaping Use: Never used  ?Substance Use Topics  ? Alcohol use: Not Currently  ? Drug use: No  ? ? ?Family History ?Family History  ?Problem Relation Age of Onset  ? Pancreatic cancer Mother 58  ? Hypertension Mother   ? Lung cancer Father 1  ?     hx smoking  ? Hypertension Father   ? Breast cancer Sister 81  ? Other Sister 41  ?     gallbladder cancer  ? Bone cancer Maternal Grandmother 68  ? Breast cancer Paternal Grandmother 45  ? ? ?No Known Allergies ? ? ?REVIEW OF SYSTEMS (Negative unless  checked) ? ?Constitutional: '[]'$ Weight loss  '[]'$ Fever  '[]'$ Chills ?Cardiac: '[]'$ Chest pain   '[]'$ Chest pressure   '[]'$ Palpitations   '[]'$ Shortness of breath when laying flat   '[]'$ Shortness of breath with exertion. ?Vascular:  '[]'$ Pain in legs with walking   '[]'$ Pain in legs at rest  '[]'$ History of DVT   '[]'$ Phlebitis   '[x]'$ Swelling in legs   '[]'$ Varicose veins   '[]'$ Non-healing ulcers ?Pulmonary:   '[]'$ Uses home oxygen   '[]'$ Productive cough   '[]'$ Hemoptysis   '[]'$ Wheeze  '[]'$ COPD   '[]'$ Asthma ?Neurologic:  '[]'$ Dizziness   '[]'$ Seizures   '[]'$ History of stroke   '[]'$ History of TIA  '[]'$ Aphasia   '[]'$ Vissual changes   '[]'$ Weakness or numbness in arm   '[]'$ Weakness or numbness in  leg ?Musculoskeletal:   '[]'$ Joint swelling   '[]'$ Joint pain   '[]'$ Low back pain ?Hematologic:  '[]'$ Easy bruising  '[]'$ Easy bleeding   '[]'$ Hypercoagulable state   '[]'$ Anemic ?Gastrointestinal:  '[]'$ Diarrhea   '[]'$ Vomiting  '[]'$ Gastroesophageal reflux/heartburn   '[]'$ Difficulty swallowing. ?Genitourinary:  '[]'$ Chronic kidney disease   '[]'$ Difficult urination  '[]'$ Frequent urination   '[]'$ Blood in urine ?Skin:  '[]'$ Rashes   '[]'$ Ulcers  ?Psychological:  '[]'$ History of anxiety   '[]'$  History of major depression. ? ?Physical Examination ? ?Vitals:  ? 03/09/22 1042  ?BP: (!) 157/73  ?Pulse: 80  ?Resp: 16  ?Weight: 134 lb 9.6 oz (61.1 kg)  ?Height: '5\' 4"'$  (1.626 m)  ? ?Body mass index is 23.1 kg/m?. ?Gen: WD/WN, NAD ?Head: [place]/AT, No temporalis wasting.  ?Ear/Nose/Throat: Hearing grossly intact, nares w/o erythema or drainage, pinna without lesions ?Eyes: PER, EOMI, sclera nonicteric.  ?Neck: Supple, no gross masses.  No JVD.  ?Pulmonary:  Good air movement, no audible wheezing, no use of accessory muscles.  ?Cardiac: RRR, precordium not hyperdynamic. ?Vascular:   ?Vessel Right Left  ?Radial Palpable Palpable  ?Gastrointestinal: soft, non-distended. No guarding/no peritoneal signs.  ?Musculoskeletal: M/S 5/5 throughout.  No deformity.  ?Neurologic: CN 2-12 intact. Pain and light touch intact in extremities.  Symmetrical.  Speech is fluent. Motor exam as listed above. ?Psychiatric: Judgment intact, Mood & affect appropriate for pt's clinical situation. ?Dermatologic: Venous rashes no ulcers noted.  No changes consistent with cellulitis. ?Lymph : No lichenification or skin changes of chronic lymphedema. ? ?CBC ?Lab Results  ?Component Value Date  ? WBC 13.3 (H) 01/12/2022  ? HGB 8.9 (L) 01/12/2022  ? HCT 29.0 (L) 01/12/2022  ? MCV 94.8 01/12/2022  ? PLT 480 (H) 01/12/2022  ? ? ?BMET ?   ?Component Value Date/Time  ? NA 136 10/10/2021 1243  ? NA 139 03/01/2021 1151  ? K 4.6 10/10/2021 1243  ? CL 108 10/10/2021 1243  ? CO2 22 10/10/2021 1243  ? GLUCOSE 95 10/10/2021  1243  ? BUN 51 (H) 10/10/2021 1243  ? BUN 64 (H) 03/01/2021 1151  ? CREATININE 3.32 (H) 10/10/2021 1243  ? CREATININE 1.31 (H) 10/13/2014 1007  ? CALCIUM 9.1 10/10/2021 1243  ? GFRNONAA 15 (L) 10/10/2021 1243  ? ?CrCl cannot be calculated (Patient's most recent lab result is older than the maximum 21 days allowed.). ? ?COAG ?No results found for: INR, PROTIME ? ?Radiology ?No results found. ? ? ?Assessment/Plan ?1. Chronic kidney disease, stage V ([place]) ?Recommend: ? ?At this time the patient does not have appropriate extremity access for dialysis ? ?Patient should have a left brachial cephalic fistula created. ? ?The risks, benefits and alternative therapies were reviewed in detail with the patient.  All questions were answered.  The patient agrees to proceed with surgery.   ? ?  2. Essential hypertension ?Continue antihypertensive medications as already ordered, these medications have been reviewed and there are no changes at this time.  ? ? ? ?[name], MD ? ?03/09/2022 ?10:48 AM ? ?  ?

## 2022-03-15 ENCOUNTER — Ambulatory Visit (INDEPENDENT_AMBULATORY_CARE_PROVIDER_SITE_OTHER): Payer: 59 | Admitting: Nurse Practitioner

## 2022-03-15 ENCOUNTER — Encounter (INDEPENDENT_AMBULATORY_CARE_PROVIDER_SITE_OTHER): Payer: 59

## 2022-03-15 ENCOUNTER — Other Ambulatory Visit: Payer: Self-pay

## 2022-03-15 ENCOUNTER — Encounter (INDEPENDENT_AMBULATORY_CARE_PROVIDER_SITE_OTHER): Payer: Self-pay | Admitting: Nurse Practitioner

## 2022-03-15 VITALS — BP 133/70 | HR 81 | Resp 16 | Wt 133.8 lb

## 2022-03-15 DIAGNOSIS — N186 End stage renal disease: Secondary | ICD-10-CM | POA: Diagnosis not present

## 2022-03-19 ENCOUNTER — Encounter (INDEPENDENT_AMBULATORY_CARE_PROVIDER_SITE_OTHER): Payer: Self-pay | Admitting: Nurse Practitioner

## 2022-03-19 NOTE — Progress Notes (Signed)
? ?Subjective:  ? ? Patient ID: Alejandra Hill, female    DOB: [birth year], 61 y.o.   MRN: [id number] ?Chief Complaint  ?Patient presents with  ? Follow-up  ?  Discuss L brach-ceph AVF   ? ? ?Alejandra Hill is a 61 year old female that is seen for evaluation for dialysis access. The patient has chronic renal insufficiency stage V secondary to acute kidney injury with limited recovery. The patient's most recent creatinine clearance is less than 20. The patient volume status has not yet become an issue. Patient's blood pressures been relatively well controlled.  The patient is right-handed. ?  ?The patient has been considering the various methods of dialysis and wishes to proceed with hemodialysis and therefore creation of AV access. ?  ?The patient denies amaurosis fugax or recent TIA symptoms. There are no recent neurological changes noted. ?The patient denies claudication symptoms or rest pain symptoms. ?The patient denies history of DVT, PE or superficial thrombophlebitis. ?The patient denies recent episodes of angina or shortness of breath.   ?  ?Today the patient has adequate vein diameters for creation of a left brachiocephalic AV fistula ? ? ?Review of Systems  ?All other systems reviewed and are negative. ? ?   ?Objective:  ? Physical Exam ?Vitals reviewed.  ?HENT:  ?   Head: Normocephalic.  ?Cardiovascular:  ?   Rate and Rhythm: Normal rate.  ?   Pulses:     ?     Radial pulses are 2+ on the right side and 2+ on the left side.  ?Pulmonary:  ?   Effort: Pulmonary effort is normal.  ?Skin: ?   General: Skin is warm and dry.  ?Neurological:  ?   Mental Status: She is alert and oriented to person, place, and time.  ?Psychiatric:     ?   Mood and Affect: Mood normal.     ?   Behavior: Behavior normal.     ?   Thought Content: Thought content normal.     ?   Judgment: Judgment normal.  ? ? ?BP 133/70 (BP Location: Right Arm)   Pulse 81   Resp 16   Wt 133 lb 12.8 oz (60.7 kg)   LMP 10/14/2011   BMI 22.97 kg/m?   ? ?Past Medical History:  ?Diagnosis Date  ? Allergy   ? Anemia   ? Family history of breast cancer   ? Family history of cancer of gallbladder   ? Family history of lung cancer   ? Family history of pancreatic cancer   ? GERD (gastroesophageal reflux disease)   ? Overactive bladder   ? ? ?Social History  ? ?Socioeconomic History  ? Marital status: Single  ?  Spouse name: Not on file  ? Number of children: Not on file  ? Years of education: Not on file  ? Highest education level: Not on file  ?Occupational History  ? Not on file  ?Tobacco Use  ? Smoking status: Never  ? Smokeless tobacco: Never  ?Vaping Use  ? Vaping Use: Never used  ?Substance and Sexual Activity  ? Alcohol use: Not Currently  ? Drug use: No  ? Sexual activity: Not Currently  ?  Birth control/protection: None, Post-menopausal  ?Other Topics Concern  ? Not on file  ?Social History Narrative  ? Lives alone in [place]. Works as Sports coach.  ? ?Social Determinants of Health  ? ?Financial Resource Strain: Not on file  ?Food Insecurity: Not on file  ?Transportation Needs: Not  on file  ?Physical Activity: Not on file  ?Stress: Not on file  ?Social Connections: Not on file  ?Intimate Partner Violence: Not on file  ? ? ?Past Surgical History:  ?Procedure Laterality Date  ? BREAST EXCISIONAL BIOPSY    ? BREAST SURGERY    ? fibrocystic  ? CYSTOSCOPY W/ RETROGRADES Left 03/19/2020  ? Procedure: CYSTOSCOPY WITH RETROGRADE PYELOGRAM;  Surgeon: Billey Co, MD;  Location: [facility];  Service: Urology;  Laterality: Left;  ? CYSTOSCOPY WITH BIOPSY N/A 03/19/2020  ? Procedure: CYSTOSCOPY WITH bladder BIOPSY and fulgeration;  Surgeon: Billey Co, MD;  Location: [facility] OR[facility];  Service: Urology;  Laterality: N/A;  ? CYSTOSCOPY WITH URETEROSCOPY Left 03/19/2020  ? Procedure: CYSTOSCOPY WITH URETEROSCOPY;  Surgeon: Billey Co, MD;  Location: [facility];  Service: Urology;  Laterality: Left;  ? LAPAROTOMY    ? ectopic  ? ? ?Family History  ?Problem Relation Age  of Onset  ? Pancreatic cancer Mother 43  ? Hypertension Mother   ? Lung cancer Father 71  ?     hx smoking  ? Hypertension Father   ? Breast cancer Sister 26  ? Other Sister 58  ?     gallbladder cancer  ? Bone cancer Maternal Grandmother 18  ? Breast cancer Paternal Grandmother 66  ? ? ?No Known Allergies ? ? ?  Latest Ref Rng & Units 01/12/2022  ?  1:45 PM 10/10/2021  ? 12:43 PM 07/15/2021  ? 10:28 AM  ?CBC  ?WBC 4.0 - 10.5 K/uL 13.3   7.6   6.4    ?Hemoglobin 12.0 - 15.0 g/dL 8.9   9.0   10.0    ?Hematocrit 36.0 - 46.0 % 29.0   29.4   32.6    ?Platelets 150 - 400 K/uL 480   326   233    ? ? ? ? ?CMP  ?   ?Component Value Date/Time  ? NA 136 10/10/2021 1243  ? NA 139 03/01/2021 1151  ? K 4.6 10/10/2021 1243  ? CL 108 10/10/2021 1243  ? CO2 22 10/10/2021 1243  ? GLUCOSE 95 10/10/2021 1243  ? BUN 51 (H) 10/10/2021 1243  ? BUN 64 (H) 03/01/2021 1151  ? CREATININE 3.32 (H) 10/10/2021 1243  ? CREATININE 1.31 (H) 10/13/2014 1007  ? CALCIUM 9.1 10/10/2021 1243  ? PROT 8.3 (H) 10/10/2021 1243  ? ALBUMIN 3.5 10/10/2021 1243  ? AST 15 10/10/2021 1243  ? ALT 18 10/10/2021 1243  ? ALKPHOS 58 10/10/2021 1243  ? BILITOT 0.4 10/10/2021 1243  ? GFRNONAA 15 (L) 10/10/2021 1243  ? ? ? ?No results found. ? ?   ?Assessment & Plan:  ? ?1. ESRD (end stage renal disease) ([place]) ?Recommend: ? ?At this time the patient does not have appropriate extremity access for dialysis.  Discussed with her nephrologist reveals that now is an appropriate time for fistula creation ? ?Patient should have a left brachiocephalic AV fistula created. ? ?The risks, benefits and alternative therapies were reviewed in detail with the patient.  All questions were answered.  The patient agrees to proceed with surgery.   ? ? ?Current Outpatient Medications on File Prior to Visit  ?Medication Sig Dispense Refill  ? apixaban (ELIQUIS) 5 MG TABS tablet Take by mouth.    ? calcium citrate-vitamin D (CITRACAL+D) 315-200 MG-UNIT per tablet Take 1 tablet by mouth daily.      ? ciprofloxacin (CIPRO) 500 MG tablet Take 500 mg by mouth 2 (two)  times daily.    ? ferrous sulfate 325 (65 FE) MG EC tablet Take 1 tablet (325 mg total) by mouth 2 (two) times daily with a meal. 60 tablet 2  ? GARLIC PO Take by mouth daily.    ? Multiple Vitamins-Minerals (ALIVE WOMENS ENERGY) TABS Take 1 tablet by mouth daily.     ? Specialty Vitamins Products (ONE-A-DAY CHOLESTEROL PLUS PO) Take 2 tablets by mouth in the morning and at bedtime.    ? estradiol (ESTRACE) 0.1 MG/GM vaginal cream Estrogen Cream Instruction Discard applicator Apply pea sized amount to tip of finger to urethra before bed. Wash hands well after application. Use Monday, Wednesday and Friday (Patient not taking: Reported on 03/09/2022) 42.5 g 12  ? fexofenadine (ALLEGRA) 180 MG tablet Take 180 mg by mouth daily as needed for allergies or rhinitis. (Patient not taking: Reported on 03/09/2022)    ? Garlic-DHA-EPA (GARLIC/EPA PO) Take 1 tablet by mouth daily. (Patient not taking: Reported on 03/09/2022)    ? omeprazole (PRILOSEC) 20 MG capsule Take 20 mg by mouth daily. (Patient not taking: Reported on 03/09/2022)    ? Pseudoephedrine-Guaifenesin [phone] MG TB12 Take 1 tablet by mouth daily. (Patient not taking: Reported on 03/09/2022)    ? ?No current facility-administered medications on file prior to visit.  ? ? ?There are no Patient Instructions on file for this visit. ?No follow-ups on file. ? ? ?Kris Hartmann, NP ? ? ?

## 2022-03-20 ENCOUNTER — Other Ambulatory Visit: Payer: 59

## 2022-03-22 ENCOUNTER — Ambulatory Visit: Payer: 59 | Admitting: Oncology

## 2022-03-27 ENCOUNTER — Encounter (INDEPENDENT_AMBULATORY_CARE_PROVIDER_SITE_OTHER): Payer: Self-pay | Admitting: Vascular Surgery

## 2022-03-27 DIAGNOSIS — I10 Essential (primary) hypertension: Secondary | ICD-10-CM | POA: Insufficient documentation

## 2022-03-29 ENCOUNTER — Other Ambulatory Visit (INDEPENDENT_AMBULATORY_CARE_PROVIDER_SITE_OTHER): Payer: Self-pay | Admitting: Nurse Practitioner

## 2022-03-30 ENCOUNTER — Telehealth (INDEPENDENT_AMBULATORY_CARE_PROVIDER_SITE_OTHER): Payer: Self-pay

## 2022-03-30 NOTE — Telephone Encounter (Signed)
Spoke with the patient and advised the patient of Ronette Deter recommendation regarding getting a left brachial cephalic AVF with Dr. Delana Meyer. Patient was informed that Dr. Delana Meyer may not be back in form medical leave until the end of April, and that if she would like she could wait or have Dr. Lucky Cowboy perform the surgery. Patient opted to wait until Dr. Delana Meyer returned. ?

## 2022-04-21 ENCOUNTER — Other Ambulatory Visit: Payer: Self-pay

## 2022-04-21 ENCOUNTER — Other Ambulatory Visit (INDEPENDENT_AMBULATORY_CARE_PROVIDER_SITE_OTHER): Payer: Self-pay | Admitting: Nurse Practitioner

## 2022-04-21 ENCOUNTER — Other Ambulatory Visit
Admission: RE | Admit: 2022-04-21 | Discharge: 2022-04-21 | Disposition: A | Discharge status: Home / Self Care | Payer: 59 | Source: Ambulatory Visit | Attending: Vascular Surgery | Admitting: Vascular Surgery

## 2022-04-21 DIAGNOSIS — N186 End stage renal disease: Secondary | ICD-10-CM

## 2022-04-21 DIAGNOSIS — D631 Anemia in chronic kidney disease: Secondary | ICD-10-CM

## 2022-04-21 DIAGNOSIS — Z01812 Encounter for preprocedural laboratory examination: Secondary | ICD-10-CM

## 2022-04-21 HISTORY — DX: Prediabetes: R73.03

## 2022-04-21 HISTORY — DX: Cardiac murmur, unspecified: R01.1

## 2022-04-21 HISTORY — DX: Chronic kidney disease, unspecified: N18.9

## 2022-04-21 HISTORY — DX: Hyperkalemia: E87.5

## 2022-04-21 NOTE — Patient Instructions (Addendum)
Your procedure is scheduled on: 04/28/22 - Friday ?Report to the Registration Desk on the 1st floor of the [facility]. ?To find out your arrival time, please call [phone] between 1PM - 3PM on: 04/27/22 - Thursday ?If your arrival time is 6:00 am, do not arrive prior to that time as the [facility] entrance doors do not open until 6:00 am. ? ?REMEMBER: ?Instructions that are not followed completely may result in serious medical risk, up to and including death; or upon the discretion of your surgeon and anesthesiologist your surgery may need to be rescheduled. ? ?Do not eat food or drink any fluids after midnight the night before surgery.  ?No gum chewing, lozengers or hard candies. ? ?TAKE THESE MEDICATIONS THE MORNING OF SURGERY WITH A SIP OF WATER: NONE ? ? ?Stop taking beginning 04/25/22 apixaban (ELIQUIS) 5 MG TABS tablet, may resume with MD order. ? ?One week prior to surgery: ?Stop Anti-inflammatories (NSAIDS) such as Advil, Aleve, Ibuprofen, Motrin, Naproxen, Naprosyn and Aspirin based products such as Excedrin, Goodys Powder, BC Powder. ? ?Stop ANY OVER THE COUNTER supplements until after surgery.CITRACAL+D), GARLIC PO, Multiple Vitamins-Minerals (ALIVE ). ? ?You may however, continue to take Tylenol if needed for pain up until the day of surgery. ? ?No Alcohol for 24 hours before or after surgery. ? ?No Smoking including e-cigarettes for 24 hours prior to surgery.  ?No chewable tobacco products for at least 6 hours prior to surgery.  ?No nicotine patches on the day of surgery. ? ?Do not use any "recreational" drugs for at least a week prior to your surgery.  ?Please be advised that the combination of cocaine and anesthesia may have negative outcomes, up to and including death. ?If you test positive for cocaine, your surgery will be cancelled. ? ?On the morning of surgery brush your teeth with toothpaste and water, you may rinse your mouth with mouthwash if you wish. ?Do not swallow any toothpaste  or mouthwash. ? ?Use CHG Soap or wipes as directed on instruction sheet. ? ?Do not wear jewelry, make-up, hairpins, clips or nail polish. ? ?Do not wear lotions, powders, or perfumes.  ? ?Do not shave body from the neck down 48 hours prior to surgery just in case you cut yourself which could leave a site for infection.  ?Also, freshly shaved skin may become irritated if using the CHG soap. ? ?Contact lenses, hearing aids and dentures may not be worn into surgery. ? ?Do not bring valuables to the hospital. [facility] is not responsible for any missing/lost belongings or valuables.  ? ?Notify your doctor if there is any change in your medical condition (cold, fever, infection). ? ?Wear comfortable clothing (specific to your surgery type) to the hospital. ? ?After surgery, you can help prevent lung complications by doing breathing exercises.  ?Take deep breaths and cough every 1-2 hours. Your doctor may order a device called an Incentive Spirometer to help you take deep breaths. ?When coughing or sneezing, hold a pillow firmly against your incision with both hands. This is called ?splinting.? Doing this helps protect your incision. It also decreases belly discomfort. ? ?If you are being admitted to the hospital overnight, leave your suitcase in the car. ?After surgery it may be brought to your room. ? ?If you are being discharged the day of surgery, you will not be allowed to drive home. ?You will need a responsible adult (18 years or older) to drive you home and stay with you that night.  ? ?  If you are taking public transportation, you will need to have a responsible adult (18 years or older) with you. ?Please confirm with your physician that it is acceptable to use public transportation.  ? ?Please call the [facility] Dept. at [phone] if you have any questions about these instructions. ? ?Surgery Visitation Policy: ? ?Patients undergoing a surgery or procedure may have two family members or  support persons with them as long as the person is not COVID-19 positive or experiencing its symptoms.  ? ?Inpatient Visitation:   ? ?Visiting hours are 7 a.m. to 8 p.m. ?Up to four visitors are allowed at one time in a patient room, including children. The visitors may rotate out with other people during the day. One designated support person (adult) may remain overnight.  ?

## 2022-04-24 ENCOUNTER — Other Ambulatory Visit
Admission: RE | Admit: 2022-04-24 | Discharge: 2022-04-24 | Disposition: A | Discharge status: Home / Self Care | Payer: 59 | Source: Ambulatory Visit | Attending: Vascular Surgery | Admitting: Vascular Surgery

## 2022-04-24 DIAGNOSIS — N185 Chronic kidney disease, stage 5: Secondary | ICD-10-CM | POA: Insufficient documentation

## 2022-04-24 DIAGNOSIS — Z01818 Encounter for other preprocedural examination: Secondary | ICD-10-CM | POA: Diagnosis present

## 2022-04-24 DIAGNOSIS — D631 Anemia in chronic kidney disease: Secondary | ICD-10-CM | POA: Diagnosis not present

## 2022-04-24 DIAGNOSIS — Z01812 Encounter for preprocedural laboratory examination: Secondary | ICD-10-CM

## 2022-04-24 LAB — POTASSIUM: Potassium: 4.4 mmol/L (ref 3.5–5.1)

## 2022-04-24 LAB — CBC
HCT: 31.8 % — ABNORMAL LOW (ref 36.0–46.0)
Hemoglobin: 9.6 g/dL — ABNORMAL LOW (ref 12.0–15.0)
MCH: 28.8 pg (ref 26.0–34.0)
MCHC: 30.2 g/dL (ref 30.0–36.0)
MCV: 95.5 fL (ref 80.0–100.0)
Platelets: 320 10*3/uL (ref 150–400)
RBC: 3.33 MIL/uL — ABNORMAL LOW (ref 3.87–5.11)
RDW: 13.2 % (ref 11.5–15.5)
WBC: 6.8 10*3/uL (ref 4.0–10.5)
nRBC: 0 % (ref 0.0–0.2)

## 2022-04-24 LAB — PROTIME-INR
INR: 1.3 — ABNORMAL HIGH (ref 0.8–1.2)
Prothrombin Time: 16.5 seconds — ABNORMAL HIGH (ref 11.4–15.2)

## 2022-04-24 LAB — APTT: aPTT: 41 seconds — ABNORMAL HIGH (ref 24–36)

## 2022-04-24 MED ORDER — SODIUM CHLORIDE 0.9 % IV SOLN
INTRAVENOUS | Status: DC
  Filled 2022-04-24: qty 1000

## 2022-04-25 ENCOUNTER — Encounter: Payer: Self-pay | Admitting: Oncology

## 2022-04-28 ENCOUNTER — Other Ambulatory Visit: Payer: Self-pay

## 2022-04-28 ENCOUNTER — Ambulatory Visit
Admission: RE | Admit: 2022-04-28 | Discharge: 2022-04-28 | Disposition: A | Discharge status: Home / Self Care | Payer: BLUE CROSS/BLUE SHIELD | Source: Ambulatory Visit | Attending: Vascular Surgery | Admitting: Vascular Surgery

## 2022-04-28 ENCOUNTER — Ambulatory Visit: Payer: BLUE CROSS/BLUE SHIELD | Admitting: Urgent Care

## 2022-04-28 ENCOUNTER — Encounter: Payer: Self-pay | Admitting: Vascular Surgery

## 2022-04-28 ENCOUNTER — Encounter
Admission: RE | Disposition: A | Discharge status: Home / Self Care | Payer: Self-pay | Source: Ambulatory Visit | Attending: Vascular Surgery

## 2022-04-28 DIAGNOSIS — Z8249 Family history of ischemic heart disease and other diseases of the circulatory system: Secondary | ICD-10-CM | POA: Insufficient documentation

## 2022-04-28 DIAGNOSIS — N186 End stage renal disease: Secondary | ICD-10-CM | POA: Insufficient documentation

## 2022-04-28 DIAGNOSIS — D631 Anemia in chronic kidney disease: Secondary | ICD-10-CM | POA: Diagnosis not present

## 2022-04-28 DIAGNOSIS — E1122 Type 2 diabetes mellitus with diabetic chronic kidney disease: Secondary | ICD-10-CM | POA: Diagnosis present

## 2022-04-28 DIAGNOSIS — I120 Hypertensive chronic kidney disease with stage 5 chronic kidney disease or end stage renal disease: Secondary | ICD-10-CM | POA: Diagnosis not present

## 2022-04-28 HISTORY — PX: AV FISTULA PLACEMENT: SHX1204

## 2022-04-28 LAB — POCT I-STAT, CHEM 8
BUN: 103 mg/dL — ABNORMAL HIGH (ref 8–23)
Calcium, Ion: 1.41 mmol/L — ABNORMAL HIGH (ref 1.15–1.40)
Chloride: 112 mmol/L — ABNORMAL HIGH (ref 98–111)
Creatinine, Ser: 5.3 mg/dL — ABNORMAL HIGH (ref 0.44–1.00)
Glucose, Bld: 100 mg/dL — ABNORMAL HIGH (ref 70–99)
HCT: 35 % — ABNORMAL LOW (ref 36.0–46.0)
Hemoglobin: 11.9 g/dL — ABNORMAL LOW (ref 12.0–15.0)
Potassium: 4.5 mmol/L (ref 3.5–5.1)
Sodium: 142 mmol/L (ref 135–145)
TCO2: 20 mmol/L — ABNORMAL LOW (ref 22–32)

## 2022-04-28 LAB — ABO/RH: ABO/RH(D): A POS

## 2022-04-28 LAB — TYPE AND SCREEN
ABO/RH(D): A POS
Antibody Screen: NEGATIVE

## 2022-04-28 SURGERY — ARTERIOVENOUS (AV) FISTULA CREATION
Anesthesia: General | Laterality: Left

## 2022-04-28 MED ORDER — BUPIVACAINE LIPOSOME 1.3 % IJ SUSP
INTRAMUSCULAR | Status: DC | PRN
  Administered 2022-04-28: 14 mL

## 2022-04-28 MED ORDER — PHENYLEPHRINE 80 MCG/ML (10ML) SYRINGE FOR IV PUSH (FOR BLOOD PRESSURE SUPPORT)
PREFILLED_SYRINGE | INTRAVENOUS | Status: AC
  Filled 2022-04-28: qty 10

## 2022-04-28 MED ORDER — MIDAZOLAM HCL 2 MG/2ML IJ SOLN
INTRAMUSCULAR | Status: DC | PRN
  Administered 2022-04-28: 1 mg via INTRAVENOUS

## 2022-04-28 MED ORDER — BUPIVACAINE HCL (PF) 0.5 % IJ SOLN
INTRAMUSCULAR | Status: AC
  Filled 2022-04-28: qty 30

## 2022-04-28 MED ORDER — LIDOCAINE HCL (PF) 2 % IJ SOLN
INTRAMUSCULAR | Status: AC
  Filled 2022-04-28: qty 5

## 2022-04-28 MED ORDER — DROPERIDOL 2.5 MG/ML IJ SOLN: 0.6250 mg | Freq: Once | INTRAMUSCULAR | Status: DC | PRN

## 2022-04-28 MED ORDER — OXYCODONE HCL 5 MG/5ML PO SOLN: 5.0000 mg | Freq: Once | ORAL | Status: DC | PRN

## 2022-04-28 MED ORDER — DEXMEDETOMIDINE (PRECEDEX) IN NS 20 MCG/5ML (4 MCG/ML) IV SYRINGE
PREFILLED_SYRINGE | INTRAVENOUS | Status: DC | PRN
  Administered 2022-04-28 (×2): 8 ug via INTRAVENOUS

## 2022-04-28 MED ORDER — CEFAZOLIN SODIUM-DEXTROSE 2-4 GM/100ML-% IV SOLN
2.0000 g | INTRAVENOUS | Status: AC
  Administered 2022-04-28: 2 g via INTRAVENOUS

## 2022-04-28 MED ORDER — GLYCOPYRROLATE 0.2 MG/ML IJ SOLN
INTRAMUSCULAR | Status: AC
  Filled 2022-04-28: qty 1

## 2022-04-28 MED ORDER — FENTANYL CITRATE (PF) 100 MCG/2ML IJ SOLN: 25.0000 ug | INTRAMUSCULAR | Status: DC | PRN

## 2022-04-28 MED ORDER — ORAL CARE MOUTH RINSE
15.0000 mL | Freq: Once | OROMUCOSAL | Status: AC
Start: 2022-04-28 — End: 2022-04-28

## 2022-04-28 MED ORDER — PROPOFOL 10 MG/ML IV BOLUS
INTRAVENOUS | Status: AC
  Filled 2022-04-28: qty 20

## 2022-04-28 MED ORDER — PHENYLEPHRINE 80 MCG/ML (10ML) SYRINGE FOR IV PUSH (FOR BLOOD PRESSURE SUPPORT)
PREFILLED_SYRINGE | INTRAVENOUS | Status: DC | PRN
  Administered 2022-04-28 (×2): 80 ug via INTRAVENOUS
  Administered 2022-04-28 (×3): 160 ug via INTRAVENOUS
  Administered 2022-04-28 (×2): 80 ug via INTRAVENOUS

## 2022-04-28 MED ORDER — CHLORHEXIDINE GLUCONATE 0.12 % MT SOLN
15.0000 mL | Freq: Once | OROMUCOSAL | Status: AC
Start: 2022-04-28 — End: 2022-04-28

## 2022-04-28 MED ORDER — EPHEDRINE 5 MG/ML INJ
INTRAVENOUS | Status: AC
  Filled 2022-04-28: qty 5

## 2022-04-28 MED ORDER — HEPARIN 5000 UNITS IN NS 1000 ML (FLUSH)
INTRAMUSCULAR | Status: DC | PRN
  Administered 2022-04-28: 120 mL via INTRAMUSCULAR

## 2022-04-28 MED ORDER — FENTANYL CITRATE (PF) 100 MCG/2ML IJ SOLN
INTRAMUSCULAR | Status: DC | PRN
  Administered 2022-04-28 (×2): 50 ug via INTRAVENOUS

## 2022-04-28 MED ORDER — CHLORHEXIDINE GLUCONATE CLOTH 2 % EX PADS: 6.0000 | MEDICATED_PAD | Freq: Once | CUTANEOUS | Status: DC

## 2022-04-28 MED ORDER — DEXAMETHASONE SODIUM PHOSPHATE 10 MG/ML IJ SOLN
INTRAMUSCULAR | Status: DC | PRN
  Administered 2022-04-28: 10 mg via INTRAVENOUS

## 2022-04-28 MED ORDER — FENTANYL CITRATE (PF) 100 MCG/2ML IJ SOLN
INTRAMUSCULAR | Status: AC
  Filled 2022-04-28: qty 2

## 2022-04-28 MED ORDER — ACETAMINOPHEN 10 MG/ML IV SOLN: 1000.0000 mg | Freq: Once | INTRAVENOUS | Status: DC | PRN

## 2022-04-28 MED ORDER — HYDROCODONE-ACETAMINOPHEN 5-325 MG PO TABS
1.0000 | ORAL_TABLET | Freq: Four times a day (QID) | ORAL | 0 refills | Status: AC | PRN
Start: 2022-04-28 — End: ?

## 2022-04-28 MED ORDER — EPHEDRINE SULFATE (PRESSORS) 50 MG/ML IJ SOLN
INTRAMUSCULAR | Status: DC | PRN
  Administered 2022-04-28: 5 mg via INTRAVENOUS
  Administered 2022-04-28: 10 mg via INTRAVENOUS
  Administered 2022-04-28: 5 mg via INTRAVENOUS

## 2022-04-28 MED ORDER — ONDANSETRON HCL 4 MG/2ML IJ SOLN
INTRAMUSCULAR | Status: AC
  Filled 2022-04-28: qty 2

## 2022-04-28 MED ORDER — CHLORHEXIDINE GLUCONATE 0.12 % MT SOLN
OROMUCOSAL | Status: AC
  Administered 2022-04-28: 15 mL via OROMUCOSAL
  Filled 2022-04-28: qty 15

## 2022-04-28 MED ORDER — SODIUM CHLORIDE 0.9 % IV SOLN: INTRAVENOUS | Status: DC

## 2022-04-28 MED ORDER — BUPIVACAINE LIPOSOME 1.3 % IJ SUSP
INTRAMUSCULAR | Status: AC
  Filled 2022-04-28: qty 20

## 2022-04-28 MED ORDER — PROMETHAZINE HCL 25 MG/ML IJ SOLN: 6.2500 mg | INTRAMUSCULAR | Status: DC | PRN

## 2022-04-28 MED ORDER — MIDAZOLAM HCL 2 MG/2ML IJ SOLN
INTRAMUSCULAR | Status: AC
  Filled 2022-04-28: qty 2

## 2022-04-28 MED ORDER — PROPOFOL 10 MG/ML IV BOLUS
INTRAVENOUS | Status: DC | PRN
  Administered 2022-04-28: 30 mg via INTRAVENOUS
  Administered 2022-04-28: 120 mg via INTRAVENOUS

## 2022-04-28 MED ORDER — LIDOCAINE HCL (CARDIAC) PF 100 MG/5ML IV SOSY
PREFILLED_SYRINGE | INTRAVENOUS | Status: DC | PRN
  Administered 2022-04-28: 60 mg via INTRAVENOUS

## 2022-04-28 MED ORDER — HEPARIN SODIUM (PORCINE) 5000 UNIT/ML IJ SOLN
INTRAMUSCULAR | Status: AC
  Filled 2022-04-28: qty 1

## 2022-04-28 MED ORDER — CEFAZOLIN SODIUM-DEXTROSE 2-4 GM/100ML-% IV SOLN
INTRAVENOUS | Status: AC
  Filled 2022-04-28: qty 100

## 2022-04-28 MED ORDER — GLYCOPYRROLATE 0.2 MG/ML IJ SOLN
INTRAMUSCULAR | Status: DC | PRN
  Administered 2022-04-28: .2 mg via INTRAVENOUS

## 2022-04-28 MED ORDER — ACETAMINOPHEN 10 MG/ML IV SOLN
INTRAVENOUS | Status: DC | PRN
Start: 2022-04-28 — End: 2022-04-28
  Administered 2022-04-28: 1000 mg via INTRAVENOUS

## 2022-04-28 MED ORDER — CHLORHEXIDINE GLUCONATE CLOTH 2 % EX PADS
6.0000 | MEDICATED_PAD | Freq: Once | CUTANEOUS | Status: AC
  Administered 2022-04-28: 6 via TOPICAL

## 2022-04-28 MED ORDER — ACETAMINOPHEN 10 MG/ML IV SOLN
INTRAVENOUS | Status: AC
  Filled 2022-04-28: qty 100

## 2022-04-28 MED ORDER — OXYCODONE HCL 5 MG PO TABS: 5.0000 mg | ORAL_TABLET | Freq: Once | ORAL | Status: DC | PRN

## 2022-04-28 MED ORDER — ONDANSETRON HCL 4 MG/2ML IJ SOLN
INTRAMUSCULAR | Status: DC | PRN
  Administered 2022-04-28: 4 mg via INTRAVENOUS

## 2022-04-28 SURGICAL SUPPLY — 44 items
BAG DECANTER FOR FLEXI CONT (MISCELLANEOUS) ×2 IMPLANT
BLADE SURG SZ11 CARB STEEL (BLADE) ×2 IMPLANT
BOOT SUTURE AID YELLOW STND (SUTURE) ×2 IMPLANT
BRUSH SCRUB EZ  4% CHG (MISCELLANEOUS) ×1
BRUSH SCRUB EZ 4% CHG (MISCELLANEOUS) ×1 IMPLANT
CHLORAPREP W/TINT 26 (MISCELLANEOUS) ×2 IMPLANT
DERMABOND ADVANCED (GAUZE/BANDAGES/DRESSINGS) ×1
DERMABOND ADVANCED .7 DNX12 (GAUZE/BANDAGES/DRESSINGS) ×1 IMPLANT
ELECT CAUTERY BLADE 6.4 (BLADE) ×2 IMPLANT
ELECT REM PT RETURN 9FT ADLT (ELECTROSURGICAL) ×2
ELECTRODE REM PT RTRN 9FT ADLT (ELECTROSURGICAL) ×1 IMPLANT
GLOVE SURG SYN 8.0 (GLOVE) ×2 IMPLANT
GLOVE SURG SYN 8.0 PF PI (GLOVE) ×1 IMPLANT
GOWN STRL REUS W/ TWL LRG LVL3 (GOWN DISPOSABLE) ×1 IMPLANT
GOWN STRL REUS W/ TWL XL LVL3 (GOWN DISPOSABLE) ×1 IMPLANT
GOWN STRL REUS W/TWL LRG LVL3 (GOWN DISPOSABLE) ×1
GOWN STRL REUS W/TWL XL LVL3 (GOWN DISPOSABLE) ×1
IV NS 500ML (IV SOLUTION) ×1
IV NS 500ML BAXH (IV SOLUTION) ×1 IMPLANT
KIT TURNOVER KIT A (KITS) ×2 IMPLANT
LABEL OR SOLS (LABEL) ×2 IMPLANT
LOOP RED MAXI  1X406MM (MISCELLANEOUS) ×1
LOOP VESSEL MAXI 1X406 RED (MISCELLANEOUS) ×1 IMPLANT
LOOP VESSEL MINI 0.8X406 BLUE (MISCELLANEOUS) ×2 IMPLANT
LOOPS BLUE MINI 0.8X406MM (MISCELLANEOUS) ×1
MANIFOLD NEPTUNE II (INSTRUMENTS) ×2 IMPLANT
NDL HYPO 30X.5 LL (NEEDLE) IMPLANT
NEEDLE HYPO 30X.5 LL (NEEDLE) IMPLANT
PACK EXTREMITY ARMC (MISCELLANEOUS) ×2 IMPLANT
PAD PREP 24X41 OB/GYN DISP (PERSONAL CARE ITEMS) ×2 IMPLANT
STOCKINETTE 48X4 2 PLY STRL (GAUZE/BANDAGES/DRESSINGS) ×1 IMPLANT
STOCKINETTE STRL 4IN 9604848 (GAUZE/BANDAGES/DRESSINGS) ×2 IMPLANT
SUT MNCRL+ 5-0 UNDYED PC-3 (SUTURE) ×1 IMPLANT
SUT MONOCRYL 5-0 (SUTURE) ×1
SUT PROLENE 6 0 BV (SUTURE) ×8 IMPLANT
SUT SILK 2 0 (SUTURE) ×1
SUT SILK 2-0 18XBRD TIE 12 (SUTURE) ×1 IMPLANT
SUT SILK 3 0 (SUTURE) ×1
SUT SILK 3-0 18XBRD TIE 12 (SUTURE) ×1 IMPLANT
SUT SILK 4 0 (SUTURE) ×1
SUT SILK 4-0 18XBRD TIE 12 (SUTURE) ×1 IMPLANT
SUT VIC AB 3-0 SH 27 (SUTURE) ×1
SUT VIC AB 3-0 SH 27X BRD (SUTURE) ×1 IMPLANT
SYR 20ML LL LF (SYRINGE) ×2 IMPLANT

## 2022-04-28 NOTE — Op Note (Signed)
? ? ? ?  OPERATIVE NOTE ? ? ?PROCEDURE: ?left brachial cephalic arteriovenous fistula placement ? ?PRE-OPERATIVE DIAGNOSIS: End Stage Renal Disease ? ?POST-OPERATIVE DIAGNOSIS: End Stage Renal Disease ? ?SURGEON: [name] ? ?ASSISTANT(S): None ? ?ANESTHESIA: general ? ?ESTIMATED BLOOD LOSS: <50 cc ? ?FINDING(S): ?3.5 mm cephalic vein ? ?SPECIMEN(S):  none ? ?INDICATIONS:   ?Alejandra Hill is a 61 y.o. female who presents with end stage renal disease.  The patient is scheduled for left brachiocephalic arteriovenous fistula placement.  The patient is aware the risks include but are not limited to: bleeding, infection, steal syndrome, nerve damage, ischemic monomelic neuropathy, failure to mature, and need for additional procedures.  The patient is aware of the risks of the procedure and elects to proceed forward. ? ?DESCRIPTION: ?After full informed written consent was obtained from the patient, the patient was brought back to the operating room and placed supine upon the operating table.  Prior to induction, the patient received IV antibiotics.   After obtaining adequate anesthesia, the patient was then prepped and draped in the standard fashion for a left arm access procedure.  ? ?A first assistant was required to provide a safe and appropriate environment for executing the surgery.  The assistant was integral in providing retraction, exposure, running suture providing suction and in the closing process. ? ? A curvilinear incision was then created midway between the radial impulse and the cephalic vein. The cephalic vein was then identified and dissected circumferentially. It was marked with a surgical marker.   ? ?Attention was then turned to the brachial artery which was exposed through the same incision and looped proximally and distally. Side branches were controlled with 4-0 silk ties. ? ?The distal segment of the vein was ligated with a  2-0 silk, and the vein was transected.  The proximal segment was  interrogated with serial dilators.  The vein accepted up to a 3.5 mm dilator without any difficulty. Heparinized saline was infused into the vein and clamped it with a small bulldog.  At this point, I reset my exposure of the brachial artery and controlled the artery with vessel loops proximally and distally.  An arteriotomy was then made with a #11 blade, and extended with a Potts scissor.  Heparinized saline was injected proximal and distal into the radial artery.  The vein was then approximated to the artery while the artery was in its native bed and subsequently the vein was beveled using Potts scissors. The vein was then sewn to the artery in an end-to-side configuration with a running stitch of 6-0 Prolene.  Prior to completing this anastomosis Flushing maneuvers were performed and the artery was allowed to forward and back bleed.  There was no evidence of clot from any vessels.  I completed the anastomosis in the usual fashion and then released all vessel loops and clamps.   ? ?There was good  thrill in the venous outflow, and there was 1+ palpable radial pulse.  At this point, I irrigated out the surgical wound.  There was no further active bleeding.  The subcutaneous tissue was reapproximated with a running stitch of 3-0 Vicryl.  The skin was then reapproximated with a running subcuticular stitch of 4-0 Vicryl.  The skin was then cleaned, dried, and reinforced with Dermabond.   ? ?The patient tolerated this procedure well.  ? ?COMPLICATIONS: None ? ?CONDITION: Good ? ? ?[name] ?[facility]  ?Office: [phone] ? ? ?04/28/2022, 9:15 AM ?  ?

## 2022-04-28 NOTE — H&P (Signed)
$'@LOGO'o$ @ ? ? ?MRN : [id number] ? ?Alejandra Hill is a 61 y.o. ([birth year]) female who presents with chief complaint of here for fistula. ? ?History of Present Illness:  ? ?The patient has chronic renal insufficiency stage V secondary to acute kidney injury with limited recovery. The patient's most recent creatinine clearance is less than 20. The patient volume status has not yet become an issue. Patient's blood pressures been relatively well controlled.  The patient is right-handed. ?  ?The patient has been considering the various methods of dialysis and wishes to proceed with hemodialysis and therefore creation of AV access. ?  ?The patient denies amaurosis fugax or recent TIA symptoms. There are no recent neurological changes noted. ?The patient denies claudication symptoms or rest pain symptoms. ?The patient denies history of DVT, PE or superficial thrombophlebitis. ?The patient denies recent episodes of angina or shortness of breath.   ?  ?Previous vein mapping adequate vein diameters for creation of a left brachiocephalic AV fistula ? ?Current Meds  ?Medication Sig  ? acetaminophen (TYLENOL) 500 MG tablet Take 1,000 mg by mouth every 6 (six) hours as needed (pain.).  ? apixaban (ELIQUIS) 5 MG TABS tablet Take 5 mg by mouth 2 (two) times daily.  ? calcium citrate-vitamin D (CITRACAL+D) 315-200 MG-UNIT per tablet Take 1 tablet by mouth every evening.  ? cetirizine-pseudoephedrine (ZYRTEC-D) 5-120 MG tablet Take 1 tablet by mouth daily as needed for allergies.  ? ferrous sulfate 325 (65 FE) MG EC tablet Take 1 tablet (325 mg total) by mouth 2 (two) times daily with a meal. (Patient taking differently: Take 975 mg by mouth every evening.)  ? GARLIC PO Take 1 tablet by mouth every evening.  ? Multiple Vitamins-Minerals (ALIVE WOMENS ENERGY) TABS Take 1 tablet by mouth every evening.  ? ? ?Past Medical History:  ?Diagnosis Date  ? Allergy   ? Anemia   ? Chronic kidney disease   ? Family history of breast cancer   ?  Family history of cancer of gallbladder   ? Family history of lung cancer   ? Family history of pancreatic cancer   ? GERD (gastroesophageal reflux disease)   ? Heart murmur   ? Hyperkalemia   ? Overactive bladder   ? Pre-diabetes   ? ? ?Past Surgical History:  ?Procedure Laterality Date  ? biopsy to muscle  2023  ? BREAST EXCISIONAL BIOPSY    ? BREAST SURGERY    ? fibrocystic  ? CYSTOSCOPY W/ RETROGRADES Left 03/19/2020  ? Procedure: CYSTOSCOPY WITH RETROGRADE PYELOGRAM;  Surgeon: Billey Co, MD;  Location: [facility] OR[facility];  Service: Urology;  Laterality: Left;  ? CYSTOSCOPY WITH BIOPSY N/A 03/19/2020  ? Procedure: CYSTOSCOPY WITH bladder BIOPSY and fulgeration;  Surgeon: Billey Co, MD;  Location: [facility];  Service: Urology;  Laterality: N/A;  ? CYSTOSCOPY WITH URETEROSCOPY Left 03/19/2020  ? Procedure: CYSTOSCOPY WITH URETEROSCOPY;  Surgeon: Billey Co, MD;  Location: [facility];  Service: Urology;  Laterality: Left;  ? LAPAROTOMY    ? ectopic  ? ? ?Social History ?Social History  ? ?Tobacco Use  ? Smoking status: Never  ? Smokeless tobacco: Never  ?Vaping Use  ? Vaping Use: Never used  ?Substance Use Topics  ? Alcohol use: Not Currently  ? Drug use: No  ? ? ?Family History ?Family History  ?Problem Relation Age of Onset  ? Pancreatic cancer Mother 43  ? Hypertension Mother   ? Lung cancer Father 85  ?  hx smoking  ? Hypertension Father   ? Breast cancer Sister 26  ? Other Sister 58  ?     gallbladder cancer  ? Bone cancer Maternal Grandmother 79  ? Breast cancer Paternal Grandmother 53  ? ? ?No Known Allergies ? ? ?REVIEW OF SYSTEMS (Negative unless checked) ? ?Constitutional: '[]'$ Weight loss  '[]'$ Fever  '[]'$ Chills ?Cardiac: '[]'$ Chest pain   '[]'$ Chest pressure   '[]'$ Palpitations   '[]'$ Shortness of breath when laying flat   '[]'$ Shortness of breath with exertion. ?Vascular:  '[]'$ Pain in legs with walking   '[]'$ Pain in legs at rest  '[]'$ History of DVT   '[]'$ Phlebitis   '[]'$ Swelling in legs   '[]'$ Varicose veins   '[]'$ Non-healing  ulcers ?Pulmonary:   '[]'$ Uses home oxygen   '[]'$ Productive cough   '[]'$ Hemoptysis   '[]'$ Wheeze  '[]'$ COPD   '[]'$ Asthma ?Neurologic:  '[]'$ Dizziness   '[]'$ Seizures   '[]'$ History of stroke   '[]'$ History of TIA  '[]'$ Aphasia   '[]'$ Vissual changes   '[]'$ Weakness or numbness in arm   '[]'$ Weakness or numbness in leg ?Musculoskeletal:   '[]'$ Joint swelling   '[]'$ Joint pain   '[]'$ Low back pain ?Hematologic:  '[]'$ Easy bruising  '[]'$ Easy bleeding   '[]'$ Hypercoagulable state   '[]'$ Anemic ?Gastrointestinal:  '[]'$ Diarrhea   '[]'$ Vomiting  '[]'$ Gastroesophageal reflux/heartburn   '[]'$ Difficulty swallowing. ?Genitourinary:  '[x]'$ Chronic kidney disease   '[]'$ Difficult urination  '[]'$ Frequent urination   '[]'$ Blood in urine ?Skin:  '[]'$ Rashes   '[]'$ Ulcers  ?Psychological:  '[]'$ History of anxiety   '[]'$  History of major depression. ? ?Physical Examination ? ?Vitals:  ? 04/28/22 0620  ?BP: 132/68  ?Pulse: 71  ?Resp: 16  ?Temp: (!) 97.4 ?F (36.3 ?C)  ?TempSrc: Temporal  ?SpO2: 100%  ?Weight: 62.6 kg  ?Height: '5\' 4"'$  (1.626 m)  ? ?Body mass index is 23.69 kg/m?. ?Gen: WD/WN, NAD ?Head: [place]/AT, No temporalis wasting.  ?Ear/Nose/Throat: Hearing grossly intact, nares w/o erythema or drainage ?Eyes: PER, EOMI, sclera nonicteric.  ?Neck: Supple, no gross masses or lesions.  No JVD.  ?Pulmonary:  Good air movement, no audible wheezing, no use of accessory muscles.  ?Cardiac: RRR, precordium non-hyperdynamic. ?Vascular:   left cephalic vein is palpable ?Vessel Right Left  ?Radial Palpable Palpable  ?Brachial Palpable Palpable  ?Gastrointestinal: soft, non-distended. No guarding/no peritoneal signs.  ?Musculoskeletal: M/S 5/5 throughout.  No deformity.  ?Neurologic: CN 2-12 intact. Pain and light touch intact in extremities.  Symmetrical.  Speech is fluent. Motor exam as listed above. ?Psychiatric: Judgment intact, Mood & affect appropriate for pt's clinical situation. ?Dermatologic: No rashes or ulcers noted.  No changes consistent with cellulitis. ? ? ?CBC ?Lab Results  ?Component Value Date  ? WBC 6.8 04/24/2022  ?  HGB 11.9 (L) 04/28/2022  ? HCT 35.0 (L) 04/28/2022  ? MCV 95.5 04/24/2022  ? PLT 320 04/24/2022  ? ? ?BMET ?   ?Component Value Date/Time  ? NA 142 04/28/2022 0650  ? NA 139 03/01/2021 1151  ? K 4.5 04/28/2022 0650  ? CL 112 (H) 04/28/2022 0650  ? CO2 22 10/10/2021 1243  ? GLUCOSE 100 (H) 04/28/2022 0650  ? BUN 103 (H) 04/28/2022 0650  ? BUN 64 (H) 03/01/2021 1151  ? CREATININE 5.30 (H) 04/28/2022 0650  ? CREATININE 1.31 (H) 10/13/2014 1007  ? CALCIUM 9.1 10/10/2021 1243  ? GFRNONAA 15 (L) 10/10/2021 1243  ? ?Estimated Creatinine Clearance: 9.4 mL/min (A) (by C-G formula based on SCr of 5.3 mg/dL (H)). ? ?COAG ?Lab Results  ?Component Value Date  ? INR 1.3 (H) 04/24/2022  ? ? ?  Radiology ?No results found. ? ? ?Assessment/Plan ?1. ESRD (end stage renal disease) ([place]) ?Recommend: ?  ?At this time the patient does not have appropriate extremity access for dialysis.  Discussed with her nephrologist reveals that now is an appropriate time for fistula creation ?  ?Patient should have a left brachiocephalic AV fistula created. ?  ?The risks, benefits and alternative therapies were reviewed in detail with the patient.  All questions were answered.  The patient agrees to proceed with surgery.   ? ? ?[name], MD ? ?04/28/2022 ?7:15 AM ? ?  ?

## 2022-04-28 NOTE — Discharge Instructions (Addendum)
AMBULATORY SURGERY  DISCHARGE INSTRUCTIONS   The drugs that you were given will stay in your system until tomorrow so for the next 24 hours you should not:  Drive an automobile Make any legal decisions Drink any alcoholic beverage   You may resume regular meals tomorrow.  Today it is better to start with liquids and gradually work up to solid foods.  You may eat anything you prefer, but it is better to start with liquids, then soup and crackers, and gradually work up to solid foods.   Please notify your doctor immediately if you have any unusual bleeding, trouble breathing, redness and pain at the surgery site, drainage, fever, or pain not relieved by medication.    Additional Instructions:    WEAR GREEN EXPAREL BRACELET FOR 4 DAYS        Please contact your physician with any problems or Same Day Surgery at [phone], Monday through Friday 6 am to 4 pm, or [place] at [facility] Main number at [phone].  

## 2022-04-28 NOTE — Interval H&P Note (Signed)
History and Physical Interval Note: ? ?04/28/2022 ?7:17 AM ? ?Alejandra Hill [place]  has presented today for surgery, with the diagnosis of ESRD.  The various methods of treatment have been discussed with the patient and family. After consideration of risks, benefits and other options for treatment, the patient has consented to  Procedure(s): ?ARTERIOVENOUS (AV) FISTULA CREATION ( BRACHIAL CEPHALIC) (Left) as a surgical intervention.  The patient's history has been reviewed, patient examined, no change in status, stable for surgery.  I have reviewed the patient's chart and labs.  Questions were answered to the patient's satisfaction.   ? ? ?[name] ? ? ?

## 2022-04-28 NOTE — Anesthesia Procedure Notes (Signed)
Procedure Name: LMA Insertion ?Date/Time: 04/28/2022 7:34 AM ?Performed by: Tollie Eth, CRNA ?Pre-anesthesia Checklist: Patient identified, Patient being monitored, Timeout performed, Emergency Drugs available and Suction available ?Patient Re-evaluated:Patient Re-evaluated prior to induction ?Oxygen Delivery Method: Circle system utilized ?Preoxygenation: Pre-oxygenation with 100% oxygen ?Induction Type: IV induction ?Ventilation: Mask ventilation without difficulty ?LMA: LMA inserted ?LMA Size: 3.5 ?Tube type: Oral ?Number of attempts: 1 ?Placement Confirmation: positive ETCO2 and breath sounds checked- equal and bilateral ?Tube secured with: Tape ?Dental Injury: Teeth and Oropharynx as per pre-operative assessment  ? ? ? ? ?

## 2022-04-28 NOTE — Anesthesia Postprocedure Evaluation (Signed)
Anesthesia Post Note ? ?Patient: Alejandra Hill ? ?Procedure(s) Performed: ARTERIOVENOUS (AV) FISTULA CREATION ( BRACHIAL CEPHALIC) (Left) ? ?Patient location during evaluation: PACU ?Anesthesia Type: General ?Level of consciousness: awake and alert ?Pain management: pain level controlled ?Vital Signs Assessment: post-procedure vital signs reviewed and stable ?Respiratory status: spontaneous breathing, nonlabored ventilation and respiratory function stable ?Cardiovascular status: blood pressure returned to baseline and stable ?Postop Assessment: no apparent nausea or vomiting ?Anesthetic complications: no ? ? ?No notable events documented. ? ? ?Last Vitals:  ?Vitals:  ? 04/28/22 0930 04/28/22 0951  ?BP: (!) 120/59 125/60  ?Pulse: 82 87  ?Resp: 19 18  ?Temp:  (!) 36.2 ?C  ?SpO2: 99% 100%  ?  ?Last Pain:  ?Vitals:  ? 04/28/22 0951  ?TempSrc: Temporal  ?PainSc: 0-No pain  ? ? ?  ?  ?  ?  ?  ?  ? ?[place] Ouch ? ? ? ? ?

## 2022-04-28 NOTE — Anesthesia Preprocedure Evaluation (Addendum)
Anesthesia Evaluation  ?Patient identified by MRN, date of birth, ID band ?Patient awake ? ? ? ?Reviewed: ?Allergy & Precautions, H&P , NPO status , Patient's Chart, lab work & pertinent test results, reviewed documented beta blocker date and time  ? ?Airway ?Mallampati: III ? ?TM Distance: >3 FB ?Neck ROM: full ? ? ? Dental ? ?(+) Teeth Intact ?  ?Pulmonary ?neg pulmonary ROS,  ?  ?Pulmonary exam normal ? ? ? ? ? ? ? Cardiovascular ?Exercise Tolerance: Good ?hypertension, Normal cardiovascular exam ?Rate:Normal ? ?ECHO, stess echo 08/2021: ???NORMAL LEFT VENTRICULAR SYSTOLIC FUNCTION  ???NORMAL LA PRESSURES WITH NORMAL DIASTOLIC FUNCTION  ???NORMAL RIGHT VENTRICULAR SYSTOLIC FUNCTION  ???VALVULAR REGURGITATION: MILD MR, MILD PR, TRIVIAL TR  ???NO VALVULAR STENOSIS  ???TRIVIAL PERICARDIAL EFFUSION  ???INSUFFICIENT TR TO ESTIMATE RVSP.  ???NO PRIOR STUDY FOR COMPARISON  ? ?  ?Neuro/Psych ?negative neurological ROS ? negative psych ROS  ? GI/Hepatic ?Neg liver ROS, GERD  Medicated,  ?Endo/Other  ?diabetes ? Renal/GU ?ESRFRenal diseaseMild uremic symptoms  ?negative genitourinary ?  ?Musculoskeletal ? ? Abdominal ?Normal abdominal exam  (+)   ?Peds ? Hematology ?negative hematology ROS ?(+) Blood dyscrasia, anemia ,   ?Anesthesia Other Findings ? ? Reproductive/Obstetrics ?negative OB ROS ? ?  ? ? ? ? ? ? ? ? ? ? ? ? ? ?  ?  ? ? ? ? ? ? ?Anesthesia Physical ? ?Anesthesia Plan ? ?ASA: III ? ?Anesthesia Plan: General  ? ?Post-op Pain Management: Regional block*, Ofirmev IV (intra-op)* and Minimal or no pain anticipated  ? ?Induction: Intravenous ? ?PONV Risk Score and Plan: 3 and Ondansetron, Dexamethasone and Treatment may vary due to age or medical condition ? ?Airway Management Planned: LMA ? ?Additional Equipment:  ? ?Intra-op Plan:  ? ?Post-operative Plan: Extubation in OR ? ?Informed Consent: I have reviewed the patients History and Physical, chart, labs and discussed the procedure  including the risks, benefits and alternatives for the proposed anesthesia with the patient or authorized representative who has indicated his/her understanding and acceptance.  ? ? ? ?Dental advisory given ? ?Plan Discussed with: CRNA ? ?Anesthesia Plan Comments:   ? ? ? ? ? ?Anesthesia Quick Evaluation ? ?

## 2022-04-28 NOTE — Transfer of Care (Signed)
Immediate Anesthesia Transfer of Care Note ? ?Patient: Alejandra Hill ? ?Procedure(s) Performed: ARTERIOVENOUS (AV) FISTULA CREATION ( BRACHIAL CEPHALIC) (Left) ? ?Patient Location: PACU ? ?Anesthesia Type:General ? ?Level of Consciousness: drowsy ? ?Airway & Oxygen Therapy: Patient Spontanous Breathing and Patient connected to face mask oxygen ? ?Post-op Assessment: Report given to RN and Post -op Vital signs reviewed and stable ? ?Post vital signs: Reviewed and stable ? ?Last Vitals:  ?Vitals Value Taken Time  ?BP    ?Temp    ?Pulse    ?Resp    ?SpO2    ? ? ?Last Pain:  ?Vitals:  ? 04/28/22 0620  ?TempSrc: Temporal  ?PainSc: 0-No pain  ?   ? ?  ? ?Complications: No notable events documented. ?

## 2022-05-08 ENCOUNTER — Other Ambulatory Visit: Payer: Self-pay | Admitting: Family Medicine

## 2022-05-23 NOTE — Progress Notes (Signed)
Patient ID: Alejandra Hill, female   DOB: [birth year], 61 y.o.   MRN: [id number]  No chief complaint on file.   HPI Alejandra Hill is a 61 y.o. female.    04/28/2022  left brachial cephalic arteriovenous fistula placement  She denies pain at the surgery site no hand pain   Past Medical History:  Diagnosis Date   Allergy    Anemia    Chronic kidney disease    Family history of breast cancer    Family history of cancer of gallbladder    Family history of lung cancer    Family history of pancreatic cancer    GERD (gastroesophageal reflux disease)    Heart murmur    Hyperkalemia    Overactive bladder    Pre-diabetes     Past Surgical History:  Procedure Laterality Date   AV FISTULA PLACEMENT Left 04/28/2022   Procedure: ARTERIOVENOUS (AV) FISTULA CREATION ( BRACHIAL CEPHALIC);  Surgeon: Katha Cabal, MD;  Location: [facility];  Service: Vascular;  Laterality: Left;   biopsy to muscle  2023   BREAST EXCISIONAL BIOPSY     BREAST SURGERY     fibrocystic   CYSTOSCOPY W/ RETROGRADES Left 03/19/2020   Procedure: CYSTOSCOPY WITH RETROGRADE PYELOGRAM;  Surgeon: Billey Co, MD;  Location: [facility];  Service: Urology;  Laterality: Left;   CYSTOSCOPY WITH BIOPSY N/A 03/19/2020   Procedure: CYSTOSCOPY WITH bladder BIOPSY and fulgeration;  Surgeon: Billey Co, MD;  Location: [facility];  Service: Urology;  Laterality: N/A;   CYSTOSCOPY WITH URETEROSCOPY Left 03/19/2020   Procedure: CYSTOSCOPY WITH URETEROSCOPY;  Surgeon: Billey Co, MD;  Location: [facility];  Service: Urology;  Laterality: Left;   LAPAROTOMY     ectopic      No Known Allergies  Current Outpatient Medications  Medication Sig Dispense Refill   acetaminophen (TYLENOL) 500 MG tablet Take 1,000 mg by mouth every 6 (six) hours as needed (pain.).     apixaban (ELIQUIS) 5 MG TABS tablet Take 5 mg by mouth 2 (two) times daily.     calcium citrate-vitamin D (CITRACAL+D) 315-200 MG-UNIT per tablet  Take 1 tablet by mouth every evening.     cetirizine-pseudoephedrine (ZYRTEC-D) 5-120 MG tablet Take 1 tablet by mouth daily as needed for allergies.     ferrous sulfate 325 (65 FE) MG EC tablet Take 1 tablet (325 mg total) by mouth 2 (two) times daily with a meal. (Patient taking differently: Take 975 mg by mouth every evening.) 60 tablet 2   GARLIC PO Take 1 tablet by mouth every evening.     HYDROcodone-acetaminophen (NORCO) 5-325 MG tablet Take 1-2 tablets by mouth every 6 (six) hours as needed for moderate pain or severe pain. 20 tablet 0   Multiple Vitamins-Minerals (ALIVE WOMENS ENERGY) TABS Take 1 tablet by mouth every evening.     No current facility-administered medications for this visit.        Physical Exam LMP 10/14/2011  Gen:  WD/WN, NAD Skin: incision C/D/I, left brachial cephalic fistula good thrill good bruit     Assessment/Plan: 1. Chronic kidney disease, stage V ([place]) Recommend:  The patient is doing well and currently has adequate dialysis access. The patient's dialysis center is not reporting any access issues. Flow pattern is stable when compared to the prior ultrasound.  The patient should have a duplex ultrasound of the dialysis access in 6 months. The patient will follow-up with me in the office after each  ultrasound    - VAS US DUPLEX DIALYSIS ACCESS (AVF, AVG); Future      [name] 05/23/2022, 11:00 AM   This note was created with Dragon medical transcription system.  Any errors from dictation are unintentional.

## 2022-05-24 ENCOUNTER — Other Ambulatory Visit (INDEPENDENT_AMBULATORY_CARE_PROVIDER_SITE_OTHER): Payer: Self-pay | Admitting: Vascular Surgery

## 2022-05-24 DIAGNOSIS — N186 End stage renal disease: Secondary | ICD-10-CM

## 2022-05-24 DIAGNOSIS — Z9889 Other specified postprocedural states: Secondary | ICD-10-CM

## 2022-05-25 ENCOUNTER — Encounter (INDEPENDENT_AMBULATORY_CARE_PROVIDER_SITE_OTHER): Payer: Self-pay | Admitting: Vascular Surgery

## 2022-05-25 ENCOUNTER — Ambulatory Visit (INDEPENDENT_AMBULATORY_CARE_PROVIDER_SITE_OTHER): Payer: 59 | Admitting: Vascular Surgery

## 2022-05-25 ENCOUNTER — Ambulatory Visit (INDEPENDENT_AMBULATORY_CARE_PROVIDER_SITE_OTHER): Payer: 59

## 2022-05-25 VITALS — BP 158/66 | HR 83 | Resp 16 | Wt 142.2 lb

## 2022-05-25 DIAGNOSIS — N185 Chronic kidney disease, stage 5: Secondary | ICD-10-CM

## 2022-05-25 DIAGNOSIS — N186 End stage renal disease: Secondary | ICD-10-CM | POA: Diagnosis not present

## 2022-05-25 DIAGNOSIS — Z9889 Other specified postprocedural states: Secondary | ICD-10-CM

## 2022-05-28 ENCOUNTER — Encounter (INDEPENDENT_AMBULATORY_CARE_PROVIDER_SITE_OTHER): Payer: Self-pay | Admitting: Vascular Surgery

## 2022-05-29 ENCOUNTER — Other Ambulatory Visit: Payer: 59

## 2022-08-08 ENCOUNTER — Other Ambulatory Visit: Payer: Self-pay | Admitting: Family Medicine

## 2022-08-08 DIAGNOSIS — Z1231 Encounter for screening mammogram for malignant neoplasm of breast: Secondary | ICD-10-CM

## 2022-08-17 ENCOUNTER — Encounter: Payer: Self-pay | Admitting: Oncology

## 2022-08-24 ENCOUNTER — Ambulatory Visit (INDEPENDENT_AMBULATORY_CARE_PROVIDER_SITE_OTHER): Payer: BLUE CROSS/BLUE SHIELD

## 2022-08-24 ENCOUNTER — Ambulatory Visit (INDEPENDENT_AMBULATORY_CARE_PROVIDER_SITE_OTHER): Payer: 59 | Admitting: Vascular Surgery

## 2022-08-24 DIAGNOSIS — N185 Chronic kidney disease, stage 5: Secondary | ICD-10-CM

## 2022-09-04 ENCOUNTER — Ambulatory Visit
Admission: RE | Admit: 2022-09-04 | Discharge: 2022-09-04 | Disposition: A | Discharge status: Home / Self Care | Payer: BLUE CROSS/BLUE SHIELD | Source: Ambulatory Visit | Attending: Family Medicine | Admitting: Family Medicine

## 2022-09-04 DIAGNOSIS — Z1231 Encounter for screening mammogram for malignant neoplasm of breast: Secondary | ICD-10-CM | POA: Diagnosis present

## 2022-10-23 ENCOUNTER — Encounter (INDEPENDENT_AMBULATORY_CARE_PROVIDER_SITE_OTHER): Payer: Self-pay

## 2022-11-22 ENCOUNTER — Other Ambulatory Visit (INDEPENDENT_AMBULATORY_CARE_PROVIDER_SITE_OTHER): Payer: Self-pay | Admitting: Nurse Practitioner

## 2022-11-22 DIAGNOSIS — N185 Chronic kidney disease, stage 5: Secondary | ICD-10-CM

## 2022-11-23 ENCOUNTER — Ambulatory Visit (INDEPENDENT_AMBULATORY_CARE_PROVIDER_SITE_OTHER): Payer: BLUE CROSS/BLUE SHIELD | Admitting: Nurse Practitioner

## 2022-11-23 ENCOUNTER — Encounter (INDEPENDENT_AMBULATORY_CARE_PROVIDER_SITE_OTHER): Payer: Self-pay | Admitting: Nurse Practitioner

## 2022-11-23 ENCOUNTER — Ambulatory Visit (INDEPENDENT_AMBULATORY_CARE_PROVIDER_SITE_OTHER): Payer: BLUE CROSS/BLUE SHIELD

## 2022-11-23 VITALS — BP 165/67 | HR 79 | Resp 16 | Wt 152.6 lb

## 2022-11-23 DIAGNOSIS — N185 Chronic kidney disease, stage 5: Secondary | ICD-10-CM

## 2022-12-02 ENCOUNTER — Encounter (INDEPENDENT_AMBULATORY_CARE_PROVIDER_SITE_OTHER): Payer: Self-pay | Admitting: Nurse Practitioner

## 2022-12-02 NOTE — Progress Notes (Signed)
Subjective:    Patient ID: Alejandra Hill, female    DOB: [birth year], 61 y.o.   MRN: [id number] Chief Complaint  Patient presents with   Follow-up    Ultrasound follow up    The patient returns to the office for followup of their dialysis access.   The patient has not yet begun dialysis.  The patient denies redness or swelling at the access site. The patient denies fever or chills at home or while on dialysis.  No recent shortening of the patient's walking distance or new symptoms consistent with claudication.  No history of rest pain symptoms. No new ulcers or wounds of the lower extremities have occurred.  The patient denies amaurosis fugax or recent TIA symptoms. There are no recent neurological changes noted. There is no history of DVT, PE or superficial thrombophlebitis. No recent episodes of angina or shortness of breath documented.   Duplex ultrasound of the AV access shows a patent access.  The previously noted stenosis is not significantly changed compared to last study.  Flow volume today is 782 cc/min (previous flow volume was 822 cc/min)      Review of Systems  All other systems reviewed and are negative.      Objective:   Physical Exam Vitals reviewed.  HENT:     Head: Normocephalic.  Cardiovascular:     Rate and Rhythm: Normal rate.     Pulses: Normal pulses.  Pulmonary:     Effort: Pulmonary effort is normal.  Skin:    General: Skin is warm and dry.  Neurological:     Mental Status: She is alert and oriented to person, place, and time.  Psychiatric:        Mood and Affect: Mood normal.        Behavior: Behavior normal.        Thought Content: Thought content normal.        Judgment: Judgment normal.     BP (!) 165/67 (BP Location: Left Arm)   Pulse 79   Resp 16   Wt 152 lb 9.6 oz (69.2 kg)   LMP 10/14/2011   BMI 26.19 kg/m   Past Medical History:  Diagnosis Date   Allergy    Anemia    Chronic kidney disease    Family history of  breast cancer    Family history of cancer of gallbladder    Family history of lung cancer    Family history of pancreatic cancer    GERD (gastroesophageal reflux disease)    Heart murmur    Hyperkalemia    Overactive bladder    Pre-diabetes     Social History   Socioeconomic History   Marital status: Single    Spouse name: Not on file   Number of children: Not on file   Years of education: Not on file   Highest education level: Not on file  Occupational History   Not on file  Tobacco Use   Smoking status: Never   Smokeless tobacco: Never  Vaping Use   Vaping Use: Never used  Substance and Sexual Activity   Alcohol use: Not Currently   Drug use: No   Sexual activity: Not Currently    Birth control/protection: None, Post-menopausal  Other Topics Concern   Not on file  Social History Narrative   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 Works as Sports coach.   Social Determinants of Health   Financial Resource Strain: Not on file  Food Insecurity: Not on file  Transportation Needs:  Not on file  Physical Activity: Not on file  Stress: Not on file  Social Connections: Not on file  Intimate Partner Violence: Not on file    Past Surgical History:  Procedure Laterality Date   AV FISTULA PLACEMENT Left 04/28/2022   Procedure: ARTERIOVENOUS (AV) FISTULA CREATION ( BRACHIAL CEPHALIC);  Surgeon: Katha Cabal, MD;  Location: [facility];  Service: Vascular;  Laterality: Left;   biopsy to muscle  2023   BREAST EXCISIONAL BIOPSY     BREAST SURGERY     fibrocystic   CYSTOSCOPY W/ RETROGRADES Left 03/19/2020   Procedure: CYSTOSCOPY WITH RETROGRADE PYELOGRAM;  Surgeon: Billey Co, MD;  Location: [facility];  Service: Urology;  Laterality: Left;   CYSTOSCOPY WITH BIOPSY N/A 03/19/2020   Procedure: CYSTOSCOPY WITH bladder BIOPSY and fulgeration;  Surgeon: Billey Co, MD;  Location: [facility];  Service: Urology;  Laterality: N/A;   CYSTOSCOPY WITH URETEROSCOPY Left 03/19/2020    Procedure: CYSTOSCOPY WITH URETEROSCOPY;  Surgeon: Billey Co, MD;  Location: [facility];  Service: Urology;  Laterality: Left;   LAPAROTOMY     ectopic    Family History  Problem Relation Age of Onset   Pancreatic cancer Mother 64   Hypertension Mother    Lung cancer Father 72       hx smoking   Hypertension Father    Breast cancer Sister 71   Other Sister 47       gallbladder cancer   Bone cancer Maternal Grandmother 27   Breast cancer Paternal Grandmother 75    No Known Allergies     Latest Ref Rng & Units 04/28/2022    6:50 AM 04/24/2022   10:46 AM 01/12/2022    1:45 PM  CBC  WBC 4.0 - 10.5 K/uL  6.8  13.3   Hemoglobin 12.0 - 15.0 g/dL 11.9  9.6  8.9   Hematocrit 36.0 - 46.0 % 35.0  31.8  29.0   Platelets 150 - 400 K/uL  320  480       CMP     Component Value Date/Time   NA 142 04/28/2022 0650   NA 139 03/01/2021 1151   K 4.5 04/28/2022 0650   CL 112 (H) 04/28/2022 0650   CO2 22 10/10/2021 1243   GLUCOSE 100 (H) 04/28/2022 0650   BUN 103 (H) 04/28/2022 0650   BUN 64 (H) 03/01/2021 1151   CREATININE 5.30 (H) 04/28/2022 0650   CREATININE 1.31 (H) 10/13/2014 1007   CALCIUM 9.1 10/10/2021 1243   PROT 8.3 (H) 10/10/2021 1243   ALBUMIN 3.5 10/10/2021 1243   AST 15 10/10/2021 1243   ALT 18 10/10/2021 1243   ALKPHOS 58 10/10/2021 1243   BILITOT 0.4 10/10/2021 1243   GFRNONAA 15 (L) 10/10/2021 1243     No results found.     Assessment & Plan:   1. Chronic kidney disease, stage V ([place]) Recommend:  The patient is doing well and currently has adequate dialysis access. The patient's dialysis center is not reporting any access issues. Flow pattern is stable when compared to the prior ultrasound.  The patient notes that currently we are out of network for her.  Based on this we will place referral to [facility] which is in network for her.  We will still continue to provide care if the patient requires to be seen or treated prior to establishing care with [facility].  -  VAS US DUPLEX DIALYSIS ACCESS (AVF, AVG) - Ambulatory referral to Vascular Surgery  Current Outpatient Medications on File Prior to Visit  Medication Sig Dispense Refill   acetaminophen (TYLENOL) 500 MG tablet Take 1,000 mg by mouth every 6 (six) hours as needed (pain.).     apixaban (ELIQUIS) 5 MG TABS tablet Take 5 mg by mouth 2 (two) times daily.     calcium citrate-vitamin D (CITRACAL+D) 315-200 MG-UNIT per tablet Take 1 tablet by mouth every evening.     cetirizine-pseudoephedrine (ZYRTEC-D) 5-120 MG tablet Take 1 tablet by mouth daily as needed for allergies.     ferrous sulfate 325 (65 FE) MG EC tablet Take 1 tablet (325 mg total) by mouth 2 (two) times daily with a meal. (Patient taking differently: Take 975 mg by mouth every evening.) 60 tablet 2   GARLIC PO Take 1 tablet by mouth every evening.     HYDROcodone-acetaminophen (NORCO) 5-325 MG tablet Take 1-2 tablets by mouth every 6 (six) hours as needed for moderate pain or severe pain. 20 tablet 0   Multiple Vitamins-Minerals (ALIVE WOMENS ENERGY) TABS Take 1 tablet by mouth every evening.     No current facility-administered medications on file prior to visit.    There are no Patient Instructions on file for this visit. No follow-ups on file.   Kris Hartmann, NP

## 2022-12-02 NOTE — Progress Notes (Incomplete)
Subjective:    Patient ID: Alejandra Hill, female    DOB: 1961-06-11, 61 y.o.   MRN: [id number] Chief Complaint  Patient presents with  . Follow-up    Ultrasound follow up    HPI  Review of Systems     Objective:   Physical Exam  BP (!) 165/67 (BP Location: Left Arm)   Pulse 79   Resp 16   Wt 152 lb 9.6 oz (69.2 kg)   LMP 10/14/2011   BMI 26.19 kg/m   Past Medical History:  Diagnosis Date  . Allergy   . Anemia   . Chronic kidney disease   . Family history of breast cancer   . Family history of cancer of gallbladder   . Family history of lung cancer   . Family history of pancreatic cancer   . GERD (gastroesophageal reflux disease)   . Heart murmur   . Hyperkalemia   . Overactive bladder   . Pre-diabetes     Social History   Socioeconomic History  . Marital status: Single    Spouse name: Not on file  . Number of children: Not on file  . Years of education: Not on file  . Highest education level: Not on file  Occupational History  . Not on file  Tobacco Use  . Smoking status: Never  . Smokeless tobacco: Never  Vaping Use  . Vaping Use: Never used  Substance and Sexual Activity  . Alcohol use: Not Currently  . Drug use: No  . Sexual activity: Not Currently    Birth control/protection: None, Post-menopausal  Other Topics Concern  . 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   Social Determinants of Health   Financial Resource Strain: Not on file  Food Insecurity: Not on file  Transportation Needs: Not on file  Physical Activity: Not on file  Stress: Not on file  Social Connections: Not on file  Intimate Partner Violence: Not on file    Past Surgical History:  Procedure Laterality Date  . AV FISTULA PLACEMENT Left 04/28/2022   Procedure: ARTERIOVENOUS (AV) FISTULA CREATION ( BRACHIAL CEPHALIC);  Surgeon: Katha Cabal, MD;  Location: [facility];  Service: Vascular;  Laterality: Left;  . biopsy  to muscle  2023  . BREAST EXCISIONAL BIOPSY    . BREAST SURGERY     fibrocystic  . CYSTOSCOPY W/ RETROGRADES Left 03/19/2020   Procedure: CYSTOSCOPY WITH RETROGRADE PYELOGRAM;  Surgeon: Billey Co, MD;  Location: [facility];  Service: Urology;  Laterality: Left;  . CYSTOSCOPY WITH BIOPSY N/A 03/19/2020   Procedure: CYSTOSCOPY WITH bladder BIOPSY and fulgeration;  Surgeon: Billey Co, MD;  Location: [facility];  Service: Urology;  Laterality: N/A;  . CYSTOSCOPY WITH URETEROSCOPY Left 03/19/2020   Procedure: CYSTOSCOPY WITH URETEROSCOPY;  Surgeon: Billey Co, MD;  Location: [facility];  Service: Urology;  Laterality: Left;  . LAPAROTOMY     ectopic    Family History  Problem Relation Age of Onset  . Pancreatic cancer Mother 68  . Hypertension Mother   . Lung cancer Father 16       hx smoking  . Hypertension Father   . Breast cancer Sister 39  . Other Sister 70       gallbladder cancer  . Bone cancer Maternal Grandmother 91  . Breast cancer Paternal Grandmother 33    No Known Allergies     Latest Ref Rng & Units  04/28/2022    6:50 AM 04/24/2022   10:46 AM 01/12/2022    1:45 PM  CBC  WBC 4.0 - 10.5 K/uL  6.8  13.3   Hemoglobin 12.0 - 15.0 g/dL 11.9  9.6  8.9   Hematocrit 36.0 - 46.0 % 35.0  31.8  29.0   Platelets 150 - 400 K/uL  320  480       CMP     Component Value Date/Time   NA 142 04/28/2022 0650   NA 139 03/01/2021 1151   K 4.5 04/28/2022 0650   CL 112 (H) 04/28/2022 0650   CO2 22 10/10/2021 1243   GLUCOSE 100 (H) 04/28/2022 0650   BUN 103 (H) 04/28/2022 0650   BUN 64 (H) 03/01/2021 1151   CREATININE 5.30 (H) 04/28/2022 0650   CREATININE 1.31 (H) 10/13/2014 1007   CALCIUM 9.1 10/10/2021 1243   PROT 8.3 (H) 10/10/2021 1243   ALBUMIN 3.5 10/10/2021 1243   AST 15 10/10/2021 1243   ALT 18 10/10/2021 1243   ALKPHOS 58 10/10/2021 1243   BILITOT 0.4 10/10/2021 1243   GFRNONAA 15 (L) 10/10/2021 1243     No results found.     Assessment & Plan:    1. Chronic kidney disease, stage V ([place]) Recommend:  The patient is doing well and currently has adequate dialysis access. The patient's dialysis center is not reporting any access issues. Flow pattern is stable when compared to the prior ultrasound.  The patient notes that currently we are out of network for her.  Based on this we will place referral to [facility] which is in network for her.  We will still continue to provide care if the patient requires to be seen or treated prior to establishing care with [facility].  - VAS US DUPLEX DIALYSIS ACCESS (AVF, AVG) - Ambulatory referral to Vascular Surgery   Current Outpatient Medications on File Prior to Visit  Medication Sig Dispense Refill  . acetaminophen (TYLENOL) 500 MG tablet Take 1,000 mg by mouth every 6 (six) hours as needed (pain.).    Marland Kitchen apixaban (ELIQUIS) 5 MG TABS tablet Take 5 mg by mouth 2 (two) times daily.    . calcium citrate-vitamin D (CITRACAL+D) 315-200 MG-UNIT per tablet Take 1 tablet by mouth every evening.    . cetirizine-pseudoephedrine (ZYRTEC-D) 5-120 MG tablet Take 1 tablet by mouth daily as needed for allergies.    . ferrous sulfate 325 (65 FE) MG EC tablet Take 1 tablet (325 mg total) by mouth 2 (two) times daily with a meal. (Patient taking differently: Take 975 mg by mouth every evening.) 60 tablet 2  . GARLIC PO Take 1 tablet by mouth every evening.    Marland Kitchen HYDROcodone-acetaminophen (NORCO) 5-325 MG tablet Take 1-2 tablets by mouth every 6 (six) hours as needed for moderate pain or severe pain. 20 tablet 0  . Multiple Vitamins-Minerals (ALIVE WOMENS ENERGY) TABS Take 1 tablet by mouth every evening.     No current facility-administered medications on file prior to visit.    There are no Patient Instructions on file for this visit. No follow-ups on file.   Kris Hartmann, NP

## 2023-02-19 LAB — EXTERNAL GENERIC LAB PROCEDURE: COLOGUARD: NEGATIVE

## 2023-06-29 ENCOUNTER — Encounter: Payer: Self-pay | Admitting: Oncology

## 2023-07-05 ENCOUNTER — Encounter: Payer: Self-pay | Admitting: Oncology
